# Patient Record
Sex: Female | Born: 1955
Health system: Southern US, Community
[De-identification: ages and names within clinical notes are randomized; demographics above are authoritative.]

## PROBLEM LIST (undated history)

## (undated) DIAGNOSIS — E785 Hyperlipidemia, unspecified: Secondary | ICD-10-CM

## (undated) DIAGNOSIS — H3530 Unspecified macular degeneration: Secondary | ICD-10-CM

## (undated) DIAGNOSIS — M1990 Unspecified osteoarthritis, unspecified site: Secondary | ICD-10-CM

## (undated) DIAGNOSIS — I96 Gangrene, not elsewhere classified: Secondary | ICD-10-CM

## (undated) DIAGNOSIS — F419 Anxiety disorder, unspecified: Secondary | ICD-10-CM

## (undated) DIAGNOSIS — I10 Essential (primary) hypertension: Secondary | ICD-10-CM

## (undated) DIAGNOSIS — I639 Cerebral infarction, unspecified: Secondary | ICD-10-CM

## (undated) DIAGNOSIS — A770 Spotted fever due to Rickettsia rickettsii: Secondary | ICD-10-CM

## (undated) HISTORY — DX: Anxiety disorder, unspecified: F41.9

## (undated) HISTORY — PX: OTHER SURGICAL HISTORY: SHX169

## (undated) HISTORY — DX: Gangrene, not elsewhere classified: I96

## (undated) HISTORY — DX: Unspecified osteoarthritis, unspecified site: M19.90

## (undated) HISTORY — DX: Spotted fever due to Rickettsia rickettsii: A77.0

## (undated) HISTORY — DX: Cerebral infarction, unspecified: I63.9

## (undated) HISTORY — DX: Essential (primary) hypertension: I10

## (undated) HISTORY — DX: Hyperlipidemia, unspecified: E78.5

## (undated) HISTORY — DX: Unspecified macular degeneration: H35.30

---

## 1998-05-14 ENCOUNTER — Ambulatory Visit (HOSPITAL_COMMUNITY): Admission: RE | Admit: 1998-05-14 | Discharge: 1998-05-14 | Payer: Self-pay | Admitting: Obstetrics and Gynecology

## 1998-08-16 ENCOUNTER — Ambulatory Visit (HOSPITAL_COMMUNITY): Admission: RE | Admit: 1998-08-16 | Discharge: 1998-08-16 | Payer: Self-pay | Admitting: Obstetrics and Gynecology

## 1999-05-25 ENCOUNTER — Ambulatory Visit (HOSPITAL_COMMUNITY): Admission: RE | Admit: 1999-05-25 | Discharge: 1999-05-25 | Payer: Self-pay | Admitting: Obstetrics and Gynecology

## 1999-05-25 ENCOUNTER — Encounter: Payer: Self-pay | Admitting: Obstetrics and Gynecology

## 1999-06-03 ENCOUNTER — Ambulatory Visit (HOSPITAL_COMMUNITY): Admission: RE | Admit: 1999-06-03 | Discharge: 1999-06-03 | Payer: Self-pay | Admitting: Obstetrics and Gynecology

## 1999-07-05 ENCOUNTER — Other Ambulatory Visit: Admission: RE | Admit: 1999-07-05 | Discharge: 1999-07-05 | Payer: Self-pay | Admitting: Obstetrics and Gynecology

## 1999-09-01 ENCOUNTER — Encounter (INDEPENDENT_AMBULATORY_CARE_PROVIDER_SITE_OTHER): Payer: Self-pay | Admitting: Specialist

## 1999-09-01 ENCOUNTER — Ambulatory Visit (HOSPITAL_COMMUNITY): Admission: RE | Admit: 1999-09-01 | Discharge: 1999-09-01 | Payer: Self-pay | Admitting: Gastroenterology

## 1999-09-15 ENCOUNTER — Encounter (INDEPENDENT_AMBULATORY_CARE_PROVIDER_SITE_OTHER): Payer: Self-pay

## 1999-09-15 ENCOUNTER — Other Ambulatory Visit: Admission: RE | Admit: 1999-09-15 | Discharge: 1999-09-15 | Payer: Self-pay | Admitting: Obstetrics and Gynecology

## 1999-10-05 ENCOUNTER — Other Ambulatory Visit: Admission: RE | Admit: 1999-10-05 | Discharge: 1999-10-05 | Payer: Self-pay | Admitting: Obstetrics and Gynecology

## 2000-06-04 ENCOUNTER — Ambulatory Visit (HOSPITAL_COMMUNITY): Admission: RE | Admit: 2000-06-04 | Discharge: 2000-06-04 | Payer: Self-pay | Admitting: Obstetrics and Gynecology

## 2000-06-04 ENCOUNTER — Encounter: Payer: Self-pay | Admitting: Obstetrics and Gynecology

## 2000-07-26 ENCOUNTER — Other Ambulatory Visit: Admission: RE | Admit: 2000-07-26 | Discharge: 2000-07-26 | Payer: Self-pay | Admitting: Obstetrics and Gynecology

## 2001-07-01 ENCOUNTER — Encounter: Payer: Self-pay | Admitting: Obstetrics and Gynecology

## 2001-07-01 ENCOUNTER — Ambulatory Visit (HOSPITAL_COMMUNITY): Admission: RE | Admit: 2001-07-01 | Discharge: 2001-07-01 | Payer: Self-pay | Admitting: Obstetrics and Gynecology

## 2001-07-30 ENCOUNTER — Other Ambulatory Visit: Admission: RE | Admit: 2001-07-30 | Discharge: 2001-07-30 | Payer: Self-pay | Admitting: Obstetrics and Gynecology

## 2001-11-13 DIAGNOSIS — I639 Cerebral infarction, unspecified: Secondary | ICD-10-CM

## 2001-11-13 DIAGNOSIS — A770 Spotted fever due to Rickettsia rickettsii: Secondary | ICD-10-CM

## 2001-11-13 HISTORY — DX: Cerebral infarction, unspecified: I63.9

## 2001-11-13 HISTORY — DX: Spotted fever due to Rickettsia rickettsii: A77.0

## 2002-04-05 ENCOUNTER — Encounter: Payer: Self-pay | Admitting: Emergency Medicine

## 2002-04-05 ENCOUNTER — Inpatient Hospital Stay (HOSPITAL_COMMUNITY): Admission: EM | Admit: 2002-04-05 | Discharge: 2002-05-06 | Payer: Self-pay | Admitting: Emergency Medicine

## 2002-04-05 ENCOUNTER — Encounter (INDEPENDENT_AMBULATORY_CARE_PROVIDER_SITE_OTHER): Payer: Self-pay | Admitting: Specialist

## 2002-04-05 ENCOUNTER — Encounter: Payer: Self-pay | Admitting: Internal Medicine

## 2002-04-06 ENCOUNTER — Encounter: Payer: Self-pay | Admitting: Internal Medicine

## 2002-04-07 ENCOUNTER — Encounter: Payer: Self-pay | Admitting: Internal Medicine

## 2002-04-08 ENCOUNTER — Encounter: Payer: Self-pay | Admitting: Internal Medicine

## 2002-04-09 ENCOUNTER — Encounter: Payer: Self-pay | Admitting: Internal Medicine

## 2002-04-10 ENCOUNTER — Encounter: Payer: Self-pay | Admitting: Internal Medicine

## 2002-04-11 ENCOUNTER — Encounter: Payer: Self-pay | Admitting: Internal Medicine

## 2002-04-12 ENCOUNTER — Encounter: Payer: Self-pay | Admitting: Internal Medicine

## 2002-04-13 ENCOUNTER — Encounter: Payer: Self-pay | Admitting: Critical Care Medicine

## 2002-04-14 ENCOUNTER — Encounter: Payer: Self-pay | Admitting: Internal Medicine

## 2002-04-16 ENCOUNTER — Encounter: Payer: Self-pay | Admitting: Internal Medicine

## 2002-04-17 ENCOUNTER — Encounter: Payer: Self-pay | Admitting: Internal Medicine

## 2002-04-18 ENCOUNTER — Encounter: Payer: Self-pay | Admitting: Internal Medicine

## 2002-04-19 ENCOUNTER — Encounter: Payer: Self-pay | Admitting: Internal Medicine

## 2002-04-26 ENCOUNTER — Encounter: Payer: Self-pay | Admitting: Internal Medicine

## 2002-04-27 ENCOUNTER — Encounter: Payer: Self-pay | Admitting: Internal Medicine

## 2002-04-29 ENCOUNTER — Encounter: Payer: Self-pay | Admitting: Internal Medicine

## 2002-05-01 ENCOUNTER — Encounter: Payer: Self-pay | Admitting: Internal Medicine

## 2002-05-03 ENCOUNTER — Encounter: Payer: Self-pay | Admitting: Internal Medicine

## 2002-05-06 ENCOUNTER — Inpatient Hospital Stay (HOSPITAL_COMMUNITY)
Admission: AD | Admit: 2002-05-06 | Discharge: 2002-05-27 | Payer: Self-pay | Admitting: Physical Medicine & Rehabilitation

## 2002-06-02 ENCOUNTER — Encounter
Admission: RE | Admit: 2002-06-02 | Discharge: 2002-08-01 | Payer: Self-pay | Admitting: Physical Medicine & Rehabilitation

## 2002-08-19 ENCOUNTER — Encounter: Payer: Self-pay | Admitting: Obstetrics and Gynecology

## 2002-08-19 ENCOUNTER — Ambulatory Visit (HOSPITAL_COMMUNITY): Admission: RE | Admit: 2002-08-19 | Discharge: 2002-08-19 | Payer: Self-pay | Admitting: Obstetrics and Gynecology

## 2002-09-11 ENCOUNTER — Other Ambulatory Visit: Admission: RE | Admit: 2002-09-11 | Discharge: 2002-09-11 | Payer: Self-pay | Admitting: Obstetrics and Gynecology

## 2002-12-18 ENCOUNTER — Ambulatory Visit (HOSPITAL_BASED_OUTPATIENT_CLINIC_OR_DEPARTMENT_OTHER): Admission: RE | Admit: 2002-12-18 | Discharge: 2002-12-18 | Payer: Self-pay | Admitting: Orthopedic Surgery

## 2003-02-25 ENCOUNTER — Encounter
Admission: RE | Admit: 2003-02-25 | Discharge: 2003-04-06 | Payer: Self-pay | Admitting: Physical Medicine & Rehabilitation

## 2003-10-01 ENCOUNTER — Ambulatory Visit (HOSPITAL_COMMUNITY): Admission: RE | Admit: 2003-10-01 | Discharge: 2003-10-01 | Payer: Self-pay | Admitting: Obstetrics and Gynecology

## 2003-10-27 ENCOUNTER — Other Ambulatory Visit: Admission: RE | Admit: 2003-10-27 | Discharge: 2003-10-27 | Payer: Self-pay | Admitting: Obstetrics and Gynecology

## 2004-05-19 ENCOUNTER — Encounter: Admission: RE | Admit: 2004-05-19 | Discharge: 2004-07-21 | Payer: Self-pay | Admitting: Obstetrics and Gynecology

## 2004-10-20 ENCOUNTER — Ambulatory Visit (HOSPITAL_COMMUNITY): Admission: RE | Admit: 2004-10-20 | Discharge: 2004-10-20 | Payer: Self-pay | Admitting: Obstetrics and Gynecology

## 2005-03-20 ENCOUNTER — Ambulatory Visit: Payer: Self-pay | Admitting: Gastroenterology

## 2005-03-31 ENCOUNTER — Ambulatory Visit: Payer: Self-pay | Admitting: Gastroenterology

## 2005-11-29 ENCOUNTER — Ambulatory Visit (HOSPITAL_COMMUNITY): Admission: RE | Admit: 2005-11-29 | Discharge: 2005-11-29 | Payer: Self-pay | Admitting: Obstetrics and Gynecology

## 2006-12-05 ENCOUNTER — Ambulatory Visit (HOSPITAL_COMMUNITY): Admission: RE | Admit: 2006-12-05 | Discharge: 2006-12-05 | Payer: Self-pay | Admitting: Obstetrics and Gynecology

## 2007-12-10 ENCOUNTER — Ambulatory Visit (HOSPITAL_COMMUNITY): Admission: RE | Admit: 2007-12-10 | Discharge: 2007-12-10 | Payer: Self-pay | Admitting: Obstetrics and Gynecology

## 2008-12-17 ENCOUNTER — Ambulatory Visit (HOSPITAL_COMMUNITY): Admission: RE | Admit: 2008-12-17 | Discharge: 2008-12-17 | Payer: Self-pay | Admitting: Obstetrics and Gynecology

## 2009-12-30 ENCOUNTER — Ambulatory Visit (HOSPITAL_COMMUNITY): Admission: RE | Admit: 2009-12-30 | Discharge: 2009-12-30 | Payer: Self-pay | Admitting: Obstetrics and Gynecology

## 2010-03-09 ENCOUNTER — Encounter (INDEPENDENT_AMBULATORY_CARE_PROVIDER_SITE_OTHER): Payer: Self-pay | Admitting: *Deleted

## 2010-03-11 ENCOUNTER — Ambulatory Visit: Payer: Self-pay | Admitting: Gastroenterology

## 2010-03-25 ENCOUNTER — Ambulatory Visit: Payer: Self-pay | Admitting: Gastroenterology

## 2010-03-28 ENCOUNTER — Encounter: Payer: Self-pay | Admitting: Gastroenterology

## 2010-12-04 ENCOUNTER — Encounter: Payer: Self-pay | Admitting: Obstetrics and Gynecology

## 2010-12-15 NOTE — Letter (Signed)
Summary: [facility] Instructions  [facility]  [address] [place], [place] [address]   Phone: [phone]  Fax: [phone]       [facility] Tammy [name]    18-Oct-1956    MRN: [id number]        Procedure Day Dorna Bloom:  Farrell Ours  03/25/10     Arrival Time:  9:30AM      Procedure Time:  10:30AM     Location of Procedure:                    _X _  [facility] (4th Floor)                        PREPARATION FOR COLONOSCOPY WITH MOVIPREP   Starting 5 days prior to your procedure 03/20/10 do not eat nuts, seeds, popcorn, corn, beans, peas,  salads, or any raw vegetables.  Do not take any fiber supplements (e.g. Metamucil, Citrucel, and Benefiber).  THE DAY BEFORE YOUR PROCEDURE         DATE: 03/24/10  DAY: THURSDAY  1.  Drink clear liquids the entire day-NO SOLID FOOD  2.  Do not drink anything colored red or purple.  Avoid juices with pulp.  No orange juice.  3.  Drink at least 64 oz. (8 glasses) of fluid/clear liquids during the day to prevent dehydration and help the prep work efficiently.  CLEAR LIQUIDS INCLUDE: Water Jello Ice Popsicles Tea (sugar ok, no milk/cream) Powdered fruit flavored drinks Coffee (sugar ok, no milk/cream) Gatorade Juice: apple, Insley grape, Saric cranberry  Lemonade Clear bullion, consomm, broth Carbonated beverages (any kind) Strained chicken noodle soup Hard Candy                             4.  In the morning, mix first dose of MoviPrep solution:    Empty 1 Pouch A and 1 Pouch B into the disposable container    Add lukewarm drinking water to the top line of the container. Mix to dissolve    Refrigerate (mixed solution should be used within 24 hrs)  5.  Begin drinking the prep at 5:00 p.m. The MoviPrep container is divided by 4 marks.   Every 15 minutes drink the solution down to the next mark (approximately 8 oz) until the full liter is complete.   6.  Follow completed prep with 16 oz of clear liquid of your choice  (Nothing red or purple).  Continue to drink clear liquids until bedtime.  7.  Before going to bed, mix second dose of MoviPrep solution:    Empty 1 Pouch A and 1 Pouch B into the disposable container    Add lukewarm drinking water to the top line of the container. Mix to dissolve    Refrigerate  THE DAY OF YOUR PROCEDURE      DATE: 03/25/10  DAY: FRIDAY  Beginning at 5:30AM (5 hours before procedure):         1. Every 15 minutes, drink the solution down to the next mark (approx 8 oz) until the full liter is complete.  2. Follow completed prep with 16 oz. of clear liquid of your choice.    3. You may drink clear liquids until 8:30AM (2 HOURS BEFORE PROCEDURE).   MEDICATION INSTRUCTIONS  Unless otherwise instructed, you should take regular prescription medications with a small sip of water   as early as possible the  morning of your procedure.         OTHER INSTRUCTIONS  You will need a responsible adult at least 57 years of age to accompany you and drive you home.   This person must remain in the waiting room during your procedure.  Wear loose fitting clothing that is easily removed.  Leave jewelry and other valuables at home.  However, you may wish to bring a book to read or  an iPod/MP3 player to listen to music as you wait for your procedure to start.  Remove all body piercing jewelry and leave at home.  Total time from sign-in until discharge is approximately 2-3 hours.  You should go home directly after your procedure and rest.  You can resume normal activities the  day after your procedure.  The day of your procedure you should not:   Drive   Make legal decisions   Operate machinery   Drink alcohol   Return to work  You will receive specific instructions about eating, activities and medications before you leave.    The above instructions have been reviewed and explained to me by   Wyona Almas RN  March 11, 2010 9:39 AM     I fully understand  and can verbalize these instructions _____________________________ Date _________

## 2010-12-15 NOTE — Letter (Signed)
Summary: Patient Notice- Polyp Results  [facility]  [address] [place], [place] [address]   Phone: [phone]  Fax: [phone]        Mar 28, 2010 MRN: [id number]    [facility] [address] [place], [place]  [address]    Dear Ms. Hollern,  I am pleased to inform you that the colon polyp(s) removed during your recent colonoscopy was (were) found to be benign (no cancer detected) upon pathologic examination.  I recommend you have a repeat colonoscopy examination in 5 years to look for recurrent polyps, as having colon polyps increases your risk for having recurrent polyps or even colon cancer in the future.  Should you develop new or worsening symptoms of abdominal pain, bowel habit changes or bleeding from the rectum or bowels, please schedule an evaluation with either your primary care physician or with me.  Continue treatment plan as outlined the day of your exam.  Please call us if you are having persistent problems or have questions about your condition that have not been fully answered at this time.  Sincerely,  Meryl Dare MD [facility]  This letter has been electronically signed by your physician.  Appended Document: Patient Notice- Polyp Results letter mailed

## 2010-12-15 NOTE — Procedures (Signed)
Summary: Colonoscopy  Patient: Tammy [name] Note: All result statuses are Final unless otherwise noted.  Tests: (1) Colonoscopy (COL)   COL Colonoscopy           DONE     [facility]     [address]     [place], [place]  [address]           COLONOSCOPY PROCEDURE REPORT           PATIENT:  Tammy, [name]  MR#:  [id number]     BIRTHDATE:  October 10, 1956, 54 yrs. old  GENDER:  female           ENDOSCOPIST:  Judie Petit T. Russella Dar, MD, [facility]           PROCEDURE DATE:  03/25/2010     PROCEDURE:  Colonoscopy with snare polypectomy     ASA CLASS:  Class II     INDICATIONS:  Elevated Risk Screening, family history of colon     cancer, family Hx of polyps: MGM and M aunt with colon cancer.     Mother with colon polyps.           MEDICATIONS:   Fentanyl 100 mcg IV, Versed 7 mg IV           DESCRIPTION OF PROCEDURE:   After the risks benefits and     alternatives of the procedure were thoroughly explained, informed     consent was obtained.  Digital rectal exam was performed and     revealed no abnormalities.   The LB [id number]-Q180AL [id number] endoscope     was introduced through the anus and advanced to the cecum, which     was identified by both the appendix and ileocecal valve, without     limitations.  The quality of the prep was good, using MoviPrep.     The instrument was then slowly withdrawn as the colon was fully     examined.     <<PROCEDUREIMAGES>>           FINDINGS:  Melanosis coli was found throughout the colon and was     more prominent in the left colon.  A sessile polyp was found in     the mid transverse colon. It was 5 mm in size. Polyp was snared     without cautery. Retrieval was successful. A sessile polyp was     found in the sigmoid colon. It was 4 mm in size. Polyp was snared     without cautery. Retrieval was successful. Mild diverticulosis was     found in the sigmoid colon. This was otherwise a normal     examination of the colon.  Retroflexed views in the  rectum     revealed no abnormalities.  The time to cecum =  5.25  minutes.     The scope was then withdrawn (time =  11  min) from the patient     and the procedure completed.           COMPLICATIONS:  None           ENDOSCOPIC IMPRESSION:     1) Melanosis coli     2) 5 mm sessile polyp in the mid transverse colon     3) 4 mm sessile polyp in the sigmoid colon     4) Mild diverticulosis in the sigmoid colon     RECOMMENDATIONS:     1) Await pathology results     2) Repeat Colonoscopy  in 5 years pending review of pathology     3) High fiber diet with liberal fluid intake.           Venita Lick. Russella Dar, MD, [facility]           CC: Laurann Montana, MD           n.     Rosalie DoctorVenita Lick. Shaundrea Carrigg at 03/25/2010 11:24 AM           Terrence Dupont, [id number]  Note: An exclamation mark (!) indicates a result that was not dispersed into the flowsheet. Document Creation Date: 03/25/2010 11:25 AM _______________________________________________________________________  (1) Order result status: Final Collection or observation date-time: 03/25/2010 11:19 Requested date-time:  Receipt date-time:  Reported date-time:  Referring Physician:   Ordering Physician: Claudette Head [id number]) Specimen Source:  Source: Launa Grill Order Number: [id number] Lab site:   Appended Document: Colonoscopy     Procedures Next Due Date:    Colonoscopy: 03/2015

## 2010-12-15 NOTE — Miscellaneous (Signed)
Summary: [facility] Previsit/prep  Clinical Lists Changes  Medications: Added new medication of MOVIPREP 100 GM  SOLR (PEG-KCL-NACL-NASULF-NA ASC-C) As per prep instructions. - Signed Rx of MOVIPREP 100 GM  SOLR (PEG-KCL-NACL-NASULF-NA ASC-C) As per prep instructions.;  #1 x 0;  Signed;  Entered by: Wyona Almas RN;  Authorized by: Meryl Dare MD [facility];  Method used: Electronically to [facility]*, [address] [place], [place]  [address], Ph: [phone] or [phone], Fax: [phone] Observations: Added new observation of NKA: T (03/11/2010 8:55)    Prescriptions: MOVIPREP 100 GM  SOLR (PEG-KCL-NACL-NASULF-NA ASC-C) As per prep instructions.  #1 x 0   Entered by:   Wyona Almas RN   Authorized by:   Meryl Dare MD [facility]   Signed by:   Wyona Almas RN on 03/11/2010   Method used:   Electronically to        [facility]* (retail)       [address] [place], [place]  [address]       Ph: [phone] or [phone]       Fax: [phone]   RxID:   [id number]

## 2010-12-23 ENCOUNTER — Other Ambulatory Visit (HOSPITAL_COMMUNITY): Payer: Self-pay | Admitting: Obstetrics and Gynecology

## 2010-12-23 DIAGNOSIS — Z1231 Encounter for screening mammogram for malignant neoplasm of breast: Secondary | ICD-10-CM

## 2011-01-05 ENCOUNTER — Ambulatory Visit (HOSPITAL_COMMUNITY)
Admission: RE | Admit: 2011-01-05 | Discharge: 2011-01-05 | Disposition: A | Discharge status: Home / Self Care | Payer: BC Managed Care – PPO | Source: Ambulatory Visit | Attending: Obstetrics and Gynecology | Admitting: Obstetrics and Gynecology

## 2011-01-05 DIAGNOSIS — Z1231 Encounter for screening mammogram for malignant neoplasm of breast: Secondary | ICD-10-CM

## 2011-01-19 ENCOUNTER — Other Ambulatory Visit: Payer: Self-pay | Admitting: Dermatology

## 2011-02-13 ENCOUNTER — Other Ambulatory Visit: Payer: Self-pay | Admitting: Obstetrics & Gynecology

## 2011-03-31 NOTE — Op Note (Signed)
   NAME:  Tammy Bell, Tammy Bell                         ACCOUNT NO.:  [id number]   MEDICAL RECORD NO.:  [id number]                   PATIENT TYPE:  AMB   LOCATION:  DSC                                  FACILITY:  [facility]   PHYSICIAN:  Rodney A. Chaney Malling, M.D.           DATE OF BIRTH:  06-19-1956   DATE OF PROCEDURE:  12/18/2002  DATE OF DISCHARGE:                                 OPERATIVE REPORT   PREOPERATIVE DIAGNOSIS:  Abscess fourth toe, terminal phalanx, right foot.   POSTOPERATIVE DIAGNOSIS:  Abscess fourth toe, terminal phalanx, right foot.   OPERATION:  Partial phalangectomy, right fourth toe.   SURGEON:  Lenard Galloway. Chaney Malling, M.D.   ANESTHESIA:  General.   PROCEDURE:  The patient was placed on the operating table in supine position  with a pneumatic tourniquet about the right upper thigh.  The right lower  leg was prepped with DuraPrep and draped in the usual manner.  The foot was  draped out with an Esmarch.  An elliptical excision of the nailbed of the  terminal portion of the right fourth toe was done.  The incision was carried  proximally on both sides of the joint.  Dissection was carried down to the  bony phalanx.  This was totally excised.  No abscessed formation or sinus  tract was seen at this time.  The wounds were really quite clear.  Nevertheless, the terminal phalanx was removed.  No sinus tracts were seen.  The tissue was sent for cultures.  The wound was left wide open and a single  Steri-Strip was used to close one edge of the wound.  Sterile dressings were  then applied and the patient returned to the recovery room in excellent.  The patient tolerated the procedure well.   FOLLOW UP:  To see me in my office in one week.   DISCHARGE MEDICATIONS:  Keflex antibiotic.                                               Rodney A. Chaney Malling, M.D.    RAM/MEDQ  D:  12/18/2002  T:  12/18/2002  Job:  [id number]

## 2011-03-31 NOTE — Discharge Summary (Signed)
Tammy Bell. [facility]  Patient:    [name], [name] Visit Number: [id number] MRN: [id number]          Service Type: [facility] Location: 4000 [id number] Attending Physician:  Faith Rogue T Dictated by:   Mcarthur Rossetti. Angiulli, P.A. Admit Date:  05/06/2002 Discharge Date: 05/27/2002   CC:         Santina Evans A. Orlin Hilding, M.D.  Tanya Nones. Jeral Fruit, M.D.  Jerl Santos, M.D.   Discharge Summary  DISCHARGE DIAGNOSES: 1. [facility] spotted fever with deconditioning. 2. Tracheostomy on April 17, 2002. 3. Since extubated, thrombocytopenia, resolved. 4. Dysphagia, resolved. 5. Acute renal failure, resolved. 6. Right frontal intraparenchymal hemorrhage with conservative care. 7. Dry gangrene to the toes, improved. 8. Hypertension.  HISTORY OF PRESENT ILLNESS: The patient is a 55 year old [name] female in her usual state of health until Apr 05, 2002 with noted fever, myalgia, and headache. On evaluation, noted non-pruritic rash. Platelet count 20,000, creatinine 3.7. Cranial CT scan was negative. Neurology follow-up with Dr. Orlin Hilding for altered mental status. Noted an acute right frontal intraparenchymal hemorrhage with follow-up cranial CT scan. Neurosurgery consulted Dr. Jeral Fruit. Conservative care noted. Critical Care Management per Dr. Jayme Cloud. The patient ventilator support. Tracheostomy performed on April 17, 2002. Hematology consult, Dr. Myna Hidalgo for thrombocytopenia and monitored. Subcutaneous Lovenox was added for deep vein thrombosis prophylaxis. Full workup felt to be [facility] spotted fever. The patient with digital gangrene to the toes. Cardiothoracic Surgery, Dr. Arbie Cookey consulted on April 30, 2002. Felt that the digits were dry with routine skin care. Her tracheostomy was down sized and later extubated on June 01, 2002. She was maintained on a Panda tube feed for a short time until diet advanced. Progressive gains in her mobility and endurance. She was  admitted to the rehab unit on May 06, 2002.  PAST MEDICAL HISTORY: Hypertension.  ALLERGIES: None.  SOCIAL HISTORY: Remote smoker. No alcohol.  PRIMARY CARE PHYSICIAN: Per records is Dr. Tresa Endo.  PAST SURGICAL HISTORY: 1. C-section. 2. Foot surgery. 3. Hernia repair.  FAMILY HISTORY: Lives with husband and three children in [place]. Independent prior to admission. 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  HOSPITAL COURSE: The patient did well while on rehab services with therapies initiated on a b.i.d. basis. The following issues are followed during her rehab course. Pertaining to Ms. Whites workup for [facility] spotted fever. She remained afebrile. Follow-up per neurology services. Her altered mental status continued to greatly improve and she was followed by speech therapy. Her awareness of her deficits had greatly improved as well as mobility and endurance. She was now ambulating with supervision. Full family teaching was completed. Day passes went well. Outpatient therapies had been arranged. She continued on subcutaneous Lovenox throughout her rehab stay until her mobility had improved. She exhibited no signs of deep vein thrombosis. Tracheostomy site had healed nicely. There was no shortness of breath. Her O2 sats were greater than 90% on room air. Her thrombocytopenia had resolved with latest platelet count of 361,000. Her diet had been advanced to a mechanical soft, tolerating well. Acute renal failure again had resolved with latest creatinine levels within normal limits. She received conservative care for a right frontal intraparenchymal hemorrhage per Dr. Jeral Fruit. This would received follow-up in one month. Dry gangrene to the toes with conservative care. Monitored by Dr. Karlene Lineman of Cardiothoracic Surgery. She had strong pedal pulses. Blood pressure controlled without the use of antihypertensive medication. Overall for functional mobility she  was supervision in all areas of bathing, dressing and toileting. She was continent of bowel and bladder. Ambulating supervision greater than 200 feet. Her comprehension expression, attention, awareness, and memory had now greatly improved. She needed very minimal cues. She initially had some difficulty with voiding. She received a short course of Flomax. Latest bladder scans had greatly improved. Flomax was discontinued.  LABORATORY DATA: Sodium 137, potassium 3.4, BUN 7, creatinine 0.5, hemoglobin 14.2, hematocrit 42.7.  DISCHARGE MEDICATIONS: 1. Multivitamin daily. 2. Tylenol as needed.  ACTIVITY: As tolerated.  DIET: Soft.  SPECIAL INSTRUCTIONS: No driving. Outpatient PT and OT.  FOLLOW-UP: With Dr. Riley Kill, Outpatient Rehab Medicine in one month. Dr. Tresa Endo of Medical Management, and Dr. Jeral Fruit of Neurosurgery. Dictated by:   Mcarthur Rossetti. Angiulli, P.A. Attending Physician:  Faith Rogue T DD:  05/26/02 TD:  05/27/02 Job: [id number]

## 2011-03-31 NOTE — Consult Note (Signed)
[facility]  Patient:    Tammy Bell, Tammy Bell Visit Number: [id number] MRN: [id number]          Service Type: MED Location: MICU 2114 01 Attending Physician:  Jerl Santos Dictated by:   Rose Phi. Myna Hidalgo, M.D. Proc. Date: 04/06/02 Admit Date:  04/05/2002   CC:         Danice Goltz, M.D.  Jerl Santos, M.D.  Dewayne Shorter, M.D.   Consultation Report  REFERRING PHYSICIAN:  Danice Goltz, M.D.  REASON FOR CONSULTATION:  Probable thrombotic thrombocytopenic purpura.  HISTORY OF PRESENT ILLNESS:  The patient is a very nice, 55 year old, Caucasian female with no obvious past medical history.  She had been on a blood pressure medication and amitriptyline.  She began to develop some headaches.  These were almost migraine like.  She subsequently began to develop a temperature and muscle aches.  There was some agitation.  She then began to develop a rash.  The rash was worsening.  She subsequently became hypertensive and agitated.  She was taken to the emergency room.  She had blood work done in the emergency room.  She had a Benyo cell count of 9.4, a hemoglobin of 12, a hematocrit of 34, and a platelet count of 20,000.  Her Gatlin count differential was 98 segs, 1 lymph, and 1 monocyte.  Her BUN was 68 and her creatinine was 3.7.  The sodium was 136 and potassium 3.6.  Her LFTs were elevated.  The PT was 12.5 seconds and the PTT was 34 seconds.  She was admitted to the ICU.  She subsequently had a CT scan of the brain done.  She had a seizure.  CT of the brain was negative.  There was concern about [facility] spotted fever.  She denies any obvious tick bites.  Her husband, however, has had some ticks on him.  [place]  She subsequently has decompensated.  She has ID see her.  She has had a bacterial titer sent off for [facility] spotted fever.  She has developed a worsening lactic  acidosis.  Her blood work on Apr 05, 2002, showed a Iannello cell count of 9.4, hemoglobin 12, hematocrit 34, and platelet count 20,000.  Today, her laboratory studies show a Heaps cell count of 16, hemoglobin 11.6, hematocrit 33.5, and platelet count 14,000.  The pro time was up to 17.3 seconds.  Her BUN was 59 and her creatinine was 2.7.  A blood gas done was 7.11.  A DIC panel was done.  Her pro time was 14.4 seconds.  The PTT was 42. Fibrinogen was low at 198.  A D-dimer was greater than 20.  According to the family, she really has not done anything unusual.  There has been no unusual food.  She has not eaten any kind of undercooked meat.  She has not travelled anywhere.  She has been teaching special education.  PAST MEDICAL HISTORY:  Hypertension.  ALLERGIES:  None.  MEDICATIONS: 1. Unknown beta blocker. 2. Amitriptyline 25 mg p.o. q.h.s. p.r.n.  SOCIAL HISTORY:  She is married.  She has three children.  She has no tobacco use.  There is rare alcohol use.  FAMILY HISTORY:  Remarkable for her father dying of pancreatic cancer 10 years ago.  There is hyperlipidemia.  REVIEW OF SYSTEMS:  As dictated in the history of present illness.  PHYSICAL EXAMINATION:  This is a fairly well-developed, well-nourished, [place]  female.  She is intubated.  She is sedated.  VITAL SIGNS:  Temperature 100.3 degrees (axillary), pulse 112, respiratory rate 32, blood pressure 90/60.  HEENT:  Normocephalic and atraumatic skull.  She does have an endotracheal tube in.  NECK:  No adenopathy is noted.  LUNGS:  Clear bilaterally.  CARDIAC:  Tachycardic and regular.  There are no murmurs, rubs, or bruits.  ABDOMEN:  Soft with good bowel sounds.  There is no palpable abdominal mass. No palpable hepatosplenomegaly.  BACK:  No tenderness over the spine, ribs, or hips.  EXTREMITIES:  No clubbing, cyanosis, or edema.  LABORATORY DATA:  Her peripheral blood smear shows a normochromic  normocytic population of red blood cells.  She has no schistocytes.  There are no nucleated red blood cells.  I see no nuclear formation.  The Ceci cells appear to be normal in morphology and maturation.  No immature myeloid or lymphoid forms are noted.  The platelets are quite decreased in number.  The platelets are quite large.  IMPRESSION:  The patient is a 55 year old [place] female with a severe systemic condition.  It is very difficult to put all of this together.  One certainly could consider TTP in the differential diagnosis.  However, I have never seen a patient who has not had hemolytic anemia as hemolysis is the definite hallmark for TTP.  Her reticulocyte count is 0.3.  Her LDH is elevated, but I think this is clearly more related to underlying liver involvement.  I really do not feel strongly about plasma exchange for the patient.  I really do not wish to put her through plasma exchange at the present time just because of the critical nature of her situation and the hypotension.  If I put her on a phoresis machine, this could certainly worsen her hypotension.  I will certainly follow her CBC daily.  If I do see any hint of hemolysis, then I think that we do need to pursue plasma exchange.  She is young and we certainly do need to be aggressive with her.  Again, I think that the peripheral blood smear is very important in determining the course of her care.  I just do not feel strongly regarding plasma exchange just because of the fact that she is not hemolyzing at the present time.  DIC certainly is a strong consideration given her DIC panel.  Of note, her sedimentation rate is minimally elevated and this, I would expect to have been higher.  I think it would be safe for her to be transfused.  I would only transfer her  if she was bleeding.  I will follow daily CBCs/reticulocyte counts/LDH/blood smears.  Thank you so much for allowing me to see this patient.  She  is an incredibly complicated case.  There is really no right or wrong answer for her and we just need to be as aggressive as possible with her care. Dictated by:   Rose Phi. Myna Hidalgo, M.D. Attending Physician:  Jerl Santos DD:  04/06/02 TD:  04/06/02 Job: [id number]

## 2011-03-31 NOTE — Discharge Summary (Signed)
[place]. [facility]  Patient:    Tammy Bell, Tammy Bell Visit Number: [id number] MRN: [id number]          Service Type: [facility] Location: 4000 [id number] Attending Physician:  Ranelle Oyster Dictated by:   Oley Balm Sung Amabile, M.D. [facility] Admit Date:  05/06/2002 Disc. Date: 05/06/02   CC:         Jerl Santos, M.D.  Catherine A. Orlin Hilding, M.D.  Tanya Nones. Jeral Fruit, M.D.  Gloris Manchester. Lazarus Salines, M.D.  Theressa Millard, M.D.   Discharge Summary  DATE OF BIRTH:  Apr 12, 1956  ADMISSION DIAGNOSES: 1. Fever. 2. Headache. 3. Acute renal failure. 4. Thrombocytopenia. 5. Elevated liver function tests. 6. Rash. 7. Abdominal tenderness.  DISCHARGE DIAGNOSES: 1. Severe [facility] spotted fever.    a. Vasculitis with ischemic toes and microthrombi.    b. Thrombocytopenia and disseminated intravascular coagulation.    c. Intracranial hemorrhage/cerebrovascular accident.    d. Severe sepsis. 2. Ventilator-dependent respiratory failure. 3. Seizures secondary to above. 4. Dysphagia, tracheostomy dependent, resolved. 5. Anemia. 6. Hyperglycemia. 7. Profound debilitation. 8. Critical illness polyneuropathy.  HISTORY OF PRESENT ILLNESS:  Please refer to admission history and physical for this patients initial presentation.  Briefly, she is a 55 year old woman who was in excellent health prior to her current illness.  She presented as an outpatient with fever, myalgias and headache; she also had a rash.  She was seen in the office on the day prior to admission and some blood work was drawn.  When the results returned, the patient was contacted for admission. She was initially found to have thrombocytopenia and elevated liver function tests.  Upon arrival to the hospital, she was noted to be hypotensive.  She was started immediately on doxycycline for the concern regarding a tick-borne illness, though no definite history of tick bite was provided.  She was admitted to  the intensive care unit.  HOSPITAL COURSE:  For the critical care medicine service, she was initially seen by Dr. Danice Goltz; this was on Apr 05, 2002.  Dr. Jayme Cloud noted severe sepsis secondary to possible [facility] spotted fever; she was also concerned about a severe viral illness such as mononucleosis; lastly, she raised the concern for thrombotic thrombocytopenic purpura.  Dr. Jayme Cloud recommended volume resuscitation and therapy with Rocephin and doxycycline. She requested input from infectious disease specialists.  For hypotension, she started norepinephrine.  The patient progressively worsened over the first 24 hours and required her intubation on Apr 06, 2002.  She required continued aggressive supportive for sepsis.  She was seen in consultation by Dr. Rose Phi. Ennever and he noted that there was no evidence of hemolysis, therefore, thought that TTP was unlikely.  She remained critically ill for many days and developed vasculitic changes with digital infarcts on both lower extremities.  There was altered mental status and she was noted to have small hemorrhagic cerebral infarcts; she was seen in consultation by Dr. Tanya Nones. Botero for this.  No surgery was indicated at that time.  One of the skin lesions was biopsied fairly early in the course of her illness and this did indeed show rickettsial organisms, confirming the diagnosis of [facility] spotted fever.  Her hospital course was complicated by the conditions listed above in the discharge diagnoses.  She ultimately underwent tracheostomy tube placement and was successfully weaned from mechanical ventilation by early June.  The rest of her hospital course involved by predominantly rehabilitation.  She was noted to be  profoundly weak with evidence of critical illness polyneuropathy.  She was noted to have mental status changes and depressed level of consciousness, for which she was seen by Dr. Gustavus Messing.  Weymann.  The last couple of weeks of her hospitalization really involved fairly dramatic improvement on all fronts.  At the time of this dictation, she is mentating reasonably well and communicative.  She is now decannulated.  The gangrenous areas on her toes are improving dramatically and it is felt that there is no indication for surgery.  She remains profoundly weak, greater on the left than the right side, but has worked well with occupational and physical therapy, making dramatic strides.  At this time, she is markedly improved and is now stable for discharge to the rehabilitation medicine service.  MEDICATIONS ON DISCHARGE:  Lovenox 40 mg subcu q.d.  CURRENT DIET:  Dysphagia III with chopped meats and nectar-thick liquids. Panda tube was removed on May 05, 2002. Dictated by:   Oley Balm Sung Amabile, M.D. [facility] Attending Physician:  Faith Rogue T DD:  05/06/02 TD:  05/07/02 Job: [id number]

## 2011-03-31 NOTE — Consult Note (Signed)
[place]. [facility]  Patient:    MAYCI, HANING Visit Number: [id number] MRN: [id number]          Service Type: MED Location: MICU 2114 01 Attending Physician:  Jerl Santos Dictated by:   Gloris Manchester. Lazarus Salines, M.D. Proc. Date: 04/14/02 Admit Date:  04/05/2002   CC:         Jerl Santos, M.D.  Danice Goltz, M.D.   Consultation Report  CHIEF COMPLAINT:  Prolonged intubation.  HISTORY OF PRESENT ILLNESS:  A 55 year old [place] female admitted to the hospital 10 days ago with severe sepsis and multi-organ system failure.  Was eventually discovered by positive skin biopsies to have [facility] Spotted fever with sequelae related there to.  She has had a rocky course including liver and hepatic insufficiency, respiratory insufficiency, intracranial and subarachnoid hemorrhage, thrombocytopenia, and vascular compromise.  She is making steady progress on multiple fronts.  She has been intubated 10 days. In anticipation of a prolonged wean and recovery, tracheostomy has been requested for airway and ventilatory support.  Patient has not been particularly agitated with the tube in place.  She has never had a tracheostomy before nor significant intubation, or in fact really any significant medical conditions.  No history of thyroid surgery.  She does not smoke.  PHYSICAL EXAMINATION  GENERAL:  This is a middle-aged [place] female who is orotracheally intubated. She is minimally responsive to stimuli.  I did not attempt to stimulate her greatly.  NECK:  She has palpably normal lower neck anatomy with no distinctly palpable thyroid gland.  IMPRESSION:  Prolonged intubation with anticipated slow physical, medical, and neurologic recovery.  PLAN:  I agree with indication for tracheostomy for airway security and comfort as well as pulmonary toilet.  I discussed this with her husband.  We have her on the schedule for three days from now, but will see  if there is an opportunity to move her up unless her medical status or her medical team desires a delay.  I did obtain informed consent from her husband. Dictated by:   Gloris Manchester. Lazarus Salines, M.D. Attending Physician:  Jerl Santos DD:  04/14/02 TD:  04/16/02 Job: [id number]

## 2011-03-31 NOTE — Consult Note (Signed)
[place]. [facility]  Patient:    Tammy Bell, Tammy Bell Visit Number: [id number] MRN: [id number]          Service Type: MED Location: MICU 2114 01 Attending Physician:  Jerl Santos Dictated by:   Tanya Nones. Jeral Fruit, M.D. Proc. Date: 04/07/02 Admit Date:  04/05/2002                            Consultation Report  HISTORY OF PRESENT ILLNESS:  The patient is a 55 year old female who was admitted to the hospital two days ago with a history of fever, muscle pain, flash and the findings showed low platelet count, increase of the liver function tests, fever, increase of the renal function tests. The patient was confused, she was seen by infectious disease. It seems that last night she had a episode of seizure. She had a CT scan which was essentially negative. Today, she had a new CT scan and because of the findings we were called for evaluation. The patient at the present time is sedated, she is on a ventilator. The platelets were 9000 this morning and has increased up to 35,000. At the present time, she is getting platelets plus fresh frozen plasma. Her sodium was 149, potassium was 2.9. The Daughtrey count is 23.1. The INR is 2.0, the PT is 52.  PHYSICAL EXAMINATION:  NEUROLOGICAL:  Clinically, the pupils are 2 mm, and she withdraws to pain. She is sedated.  VITAL SIGNS:  Her vital signs at the present time are pulse of 110 and blood pressure 136/90.  IMAGING STUDIES:  I have reviewed the CT scan myself, and there is a small intracerebral hematoma in the frontal area less than 1 cm with no shift and there is a subarachnoid hemorrhage.  FINDINGS:  The patient will not require any type of surgical intervention at the present time, but nevertheless it is really worrisome about this lady having a coagulopathy and with already a change in the CT scan less than 24 hours after she had a normal one. I talked to Dr. Shan Levans at length while the husband was  present. I mentioned to him that at the present time there is no need for any surgical intervention or any type of monitoring of the brain is highly contraindicated because of the coagulopathy. What I am going to proceed with now is that we are going to repeat the CT scan tomorrow morning. The crisis here in her case would be if suddenly the intracerebral hematoma increased in size and she remained with the coagulopathy. This is a serious situation and the husband is fully aware. Dictated by:   Tanya Nones. Jeral Fruit, M.D. Attending Physician:  Jerl Santos DD:  04/07/02 TD:  04/08/02 Job: [id number]

## 2011-03-31 NOTE — Consult Note (Signed)
[place]. [facility]  Patient:    Tammy Bell, Tammy Bell Visit Number: [id number] MRN: [id number]          Service Type: MED Location: MICU 2114 01 Attending Physician:  Jerl Santos Dictated by:   Danice Goltz, M.D. Proc. Date: 04/05/02 Admit Date:  04/05/2002   CC:         Theressa Millard, M.D.  Jerl Santos, M.D.  Dewayne Shorter, M.D.   Consultation Report  DATE OF BIRTH:  [birth year]  REFERRING PHYSICIAN:  Theressa Millard, M.D.  REASON FOR CONSULTATION:  Assistance with septic shock and hypovolemia.  I spent an hour of critical care time between 1840 hours to 1940 hours attending to this patient.  HISTORY:  Ms. Sweeting is a 55 year old [place] female who has been in a usual state of good health until the 18th of May, 2003, when she developed fever, myalgias, and a headache.  She apparently had fever for approximately 3 days with rather severe myalgias, somewhat responsive to ibuprofen.  She developed headaches, more pronounced from her usual migraines, approximately 2 days prior to admission.  She also has developed a rash which has become more pronounced on the day of admission.  She was seen at her primary care physicians office, Dr. Tresa Endo, the day prior to admission but at the time, the patient did not appear toxic though her platelet count was later noted to be decreased at 29,000.  She then presented to [facility] today for a repeat CBC but she was found to be delirious and confused.  Because of that, she was directed to the emergency room whereupon evaluation revealed her to be extremely hypotensive.  The ER physician attempted a left IJ line; however, this was without success.  A left femoral line was then placed without difficulty.  Because of her findings, she was empirically started on doxycycline, suspecting [facility] spotted fever.  Husband states that they remove ticks from each other "all the time" as they have pets  that become infested.  She has had, however, no ticks noted recently.  She is an avid gardener and does spend time in the garden.  Last menstrual period was approximately 2 weeks ago.  No history of tampon use since then.  No other recent exposures to infectious diseases are noted.  PAST MEDICAL HISTORY:  Remarkable for migraine headaches, menorrhagia, status post hernia repair.  She has a history of palpitations with normal 2D echocardiogram done recently.  She had an impaled needle removed from a foot in 1965.  ALLERGIES:  No known allergies.  CURRENT MEDICATIONS:  Antihypertensive medication of unknown etiology.  SOCIAL HISTORY:  She is married.  She has one child.  She is a nonsmoker.  She is a [employer] manager.  FAMILY HISTORY:  Noncontributory for this specific problem; however, she has no history of bleeding dyscrasia in the family.  REVIEW OF SYSTEMS:  Noncontributory per history of pain from husband.  The patient is too confused to give an adequate history.  PHYSICAL EXAMINATION:  GENERAL:  The patient is obviously agitated and confused.  VITAL SIGNS:  She has a blood pressure of 80/60 with a mean of 56.  She is requiring IV fluids.  Oxygen saturation is 98% on 5 liters nasal cannula O2. Heart rate is 101.  Currently she is afebrile.  HEENT:  Unremarkable with pupils being equal, reactive, and round.  They react to light and accommodation.  Oropharynx is noted to be  parched oral mucosa.  NECK:  Supple but no meningeal signs.  LUNGS:  Lungs have some scattered rhonchi throughout but no wheezes.  CARDIAC EXAMINATION:  Slightly tachycardic.  No rubs, murmurs, or gallops heard.  ABDOMEN:  Soft, nontender.  Normoactive bowel sounds heard.  GU:  The patient has a Foley in place.  GYNECOLOGIC:  I did perform a gynecological bimanual examination which revealed exquisite tenderness on manipulation of the cervix as well as tenderness around the adnexa.  No  masses could be felt.  Specifically I also did not feel any retained tampons.  We did obtain a chlamydia probe.  I did do this bimanual exam without a speculum.  EXTREMITIES:  The patient has greatly diminished capillary refill distally with cyanotic toes.  Pulses are intact.  She has no edema.  SKIN:  Shows diffuse maculopapular rash, mostly sparing arms, feet, and face. She has specifically no petechiae and no splinter hemorrhages.  NEUROLOGIC EXAMINATION:  The patient has grossly a nonfocal exam; however, she is agitated and delirious.  LABORATORY DATA:  CBC:  Ambs count of 9.4, H&H of 12.1 and 34.2, platelet count of 20,000.  She has over 20% bands.  Dohle bodies noted on toxic granulations.  Urinalysis was unremarkable.  PT, PTT and INR were normal. Electrolytes were within normal limits.  BUN elevated at 68, creatinine 3.7, bilirubin 1.3, alkaline phosphatase 158, SGOT 193, SGPT 197.  Albumin was 2.5 with a total protein of 5.3.  Arterial blood gas:  pH of 7.33, pCO2 of 29, PAO2 of 137 on 5 liters nasal cannula O2.  Chest x-ray revealed thoroughly diffuse infiltrates.  This is, however, by report as I am not able to trace the film at present.  IMPRESSION: 1. Severe sepsis secondary to questionable [facility] spotted fever.    Differential diagnosis includes acute hepatitis versus mononucleosis with    a viral syndrome; however, this is doubtful.  I am concerned with the    patients pelvic findings and therefore, pelvic inflammatory disease with    Fitzhugh-Curtis syndrome should be considered.  Noninfectious etiologies    could include idiopathic thrombocytopenic purpura (ITP) versus (TTP)    thrombotic thrombocytopenic purpura; however, the lack of petechiae makes    this diagnosis less likely.  2. Systemic inflammatory reaction syndrome with multi-organ dysfunction    syndrome secondary to the above with potential acute lung injury and    hypoxemia secondary to the  above; doubt acute pneumonia. 3. Severe hypovolemic shock. 4. Metabolic acidosis secondary to lactic acidosis due to hyperperfusion. 5. Possible (DIC) disseminated intravascular coagulation.  PLAN:  1. Aggressively resuscitate with volume, crystalloid bolus as well as     colloid.  2. Check DIC panel.  3. Check for chlamydia, GYN probe.  4. I do agree with the therapy with Rocephin and doxycycline and input with     infectious disease as obtained currently.  5. Will use vasopressors as needed in the form of Levophed if her blood     pressure fails to respond to fluids.  6. If the patient shows no significant improvement in the next 12-24 hours,     given she is so desperately ill, will consider infusion of Xigris after     repletion of platelets.  7. Will provide GI prophylaxis with Protonix.  8. Will hold all heparin products given decreased platelets as well as no PAS     hose given increased capillary fracture tendency with these in a patient  with low platelets.  9. Will assume care of the patient while in the ICU per request of Dr.     Earl Gala. 10. Further plans pending response of patient to the above. Dictated by:   Danice Goltz, M.D. Attending Physician:  Jerl Santos DD:  04/05/02 TD:  04/08/02 Job: [id number]

## 2011-03-31 NOTE — Consult Note (Signed)
[place]. [facility]  Patient:    Tammy, Bell Visit Number: [id number] MRN: [id number]          Service Type: MED Location: MICU 2114 01 Attending Physician:  Jerl Santos Dictated by:   Gustavus Messing Orlin Hilding, M.D. Proc. Date: 04/21/02 Admit Date:  04/05/2002   CC:         Jerl Santos, M.D.   Consultation Report  CHIEF COMPLAINT:  Altered mental status, [facility] spotted fever.  HISTORY OF PRESENT ILLNESS:  Tammy Bell is a 55 year old, previously well [place] woman admitted on May 24 with [facility] spotted fever with severe sequelae including sepsis, DIC, thrombocytopenia, ventilator-dependent respiratory failure now with tracheostomy, anemia, ICU neuropathy, hyperglycemia, ischemia to extremities, coma, intracranial hemorrhage, and seizures on admission.  She was seen by neurosurgery earlier because of small hemorrhages, question of increased intracranial pressure, and edema.  There was a question of a need for a drain to monitor intracranial pressure, however, this apparently was never required.  I am asked now to comment on the seizures, anticonvulsants, and prognosis.  The best I can tell, the patient has not had any seizures since admission according to the nursing staff and patients mother.  REVIEW OF SYSTEMS:  Unobtainable now.  According to the chart, she had some problems with benign palpitations.  PAST MEDICAL HISTORY:  Significant for the [facility] spotted fever with the complications outlined above; namely: sepsis, DIC, thrombocytopenia, ventilator-dependent respiratory failure, anemia, ICU neuropathy, hypoglcemia, ischemia to extremities, coma, intracranial hemorrhage, and seizure.  Prior to admission, she had only minor problems with migraine, menorrhagia, and remote herniorrhaphy.  MEDICATIONS:  Insulin, Prevacid, Diflucan, Lovenox, iron, Catapres, Tylenol, doxycycline, Dilantin just recently increased to 2  mg IV q.8h., and vancomycin.  ALLERGIES:  No known drug allergies.  SOCIAL HISTORY:  Married with one child.  No cigarette use.  Teaches special education.  FAMILY HISTORY:  Negative for bleeding dyscrasia.  PHYSICAL EXAMINATION:  VITAL SIGNS:  Temperature 98.4, pulse 86, blood pressure 145/70, respirations 26.  She has a tracheostomy.  HEENT:  Head is normocephalic, atraumatic.  SKIN:  She has ischemic changes of her feet, right greater than left.  NEUROLOGIC:  Mental status: She is dosing but arouses easily.  She nodded her head once to a question when I asked, "Can you hear me," but otherwise no response of either motor or head shake or head nod.  On further questioning, she did not shake or nod her head appropriately at any time.  She would not follow commands for me, although reportedly has been following commands intermittently of nursing staff.  Cranial nerves: Her pupils are equal and reactive.  She blinks to threat bilaterally.  I could not see disk margins well.  Extraocular movements are intact.  She tracks fully.  She has bilateral ______ ; no facial asymmetry. She has a cough.  Hearing is intact.  On motor exam, there is some motor wasting in the hands.  She has ischemic changes in her feet, right greater than left as already mentioned.  There is some spontaneous movement in the right arm at the biceps, at least 2/5.  She is otherwise not moving on command for me, although reportedly follows commands irratically.  There is slight withdrawal on the left, probably triple flexion response.  Reflexes are 1+ diffusely including ankles.  She has an upgoing toe on the left.  She does have debridement of some ischemic or gangrenous or necrotic changes on  the sole of her right foot, so I could not assess Babinski there.  Cannot assess coordination as she is not cooperative. Sensory: She will grimace to mild noxious stimulus in all four extremities.  DIAGNOSTIC DATA:  CT  scan of the brain shows gradually improving small areas of hemorrhage in the right anterior frontal region.  Questionably this is secondary hemorrhage of an infarct and some mid right frontal subarachnoid blood.  There has been resolution of a small area of presumed hemorrhage in the genu of the corpus callosum to the left.  No significant mass effect.  EEG from May 27 showed generalized slowing, questionable data, no seizures.  Dilantin level recently is only 3.7; she is now on 600 mg q.4h.  Her BMET and CBC are essentially normal.  IMPRESSION: 1. 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  This led to seizures.  The encephalopathy that we are seeing is probably more likely due to the [facility] spotted fever central nervous infection itself rather than the visible areas of bleed because similar findings in patients with other etiologies; for example, stroke or trauma, will probable not demonstrate as profound deficits as she has.  We might not be seeing all areas of micro-infarct on the CT.  RECOMMENDATIONS:  Regarding the seizures, she has had none reported for days or weeks according to the nursing staff, only when she first came in.  I would check free Dilantin level.  Pharmacy is attempting to adjust the dose.  I guess I would not push it any farther unless she has seizures.  However, adjunctive therapies all have potential adverse affects which are not desirable in this patient.  For example, Keppra has potential serious side effects including psychosis, pancytopenia, thrombocytopenia, and probably not a good choice given her recent coagulopathy and thrombocytopenia.  Depakote likewise has aplastic anemia, hepatic side effects.  [place]: Stevens-Johnson syndrome, toxic epidermal  necrosis, aplastic anemia, again all places we do  not want to go with her recent history.   Would recheck an EEG on Wednesday.  Regarding her prognosis, would guess she is capable of improvement as she can track, attend, and does follow some command for others, although I could not demonstrate it today.  Cranial nerves are normal, and that is a good sign. However, I expect this would be a slow progress over months.  She may be SACU or nursing home level, especially with a mother.  May want to get an MRI later. Dictated by:   Gustavus Messing Orlin Hilding, M.D. Attending Physician:  Jerl Santos DD:  04/21/02 TD:  04/23/02 Job: [id number]

## 2011-03-31 NOTE — Op Note (Signed)
[place]. [facility]  Patient:    Tammy Bell, Tammy Bell Visit Number: [id number] MRN: [id number]          Service Type: MED Location: MICU 2114 01 Attending Physician:  Jerl Santos Dictated by:   Gloris Manchester. Lazarus Salines, M.D. Proc. Date: 04/17/02 Admit Date:  04/05/2002   CC:         Sherin Quarry, M.D.  Danice Goltz, M.D.   Operative Report  PREOPERATIVE DIAGNOSIS: Prolonged intubation.  POSTOPERATIVE DIAGNOSIS: Prolonged intubation.  PROCEDURE PERFORMED:  Tracheostomy.  SURGEON:  Gloris Manchester. Lazarus Salines, M.D.  ANESTHESIA:  General orotracheal converted to general tracheostoma.  ESTIMATED BLOOD LOSS: Minimal.  COMPLICATIONS: None.  FINDINGS:  A long thin neck with an otherwise normal lower neck anatomy. Tracheostomy tube placed beneath the thyroid isthmus.  PROCEDURE: With the patient in a comfortable supine position, general anesthesia was administered per indwelling orotracheal tube. At an appropriate level, the patient was placed in a slight reverse Trendelenburg.  A shoulder roll was placed and the neck was extended and the head supported.  The neck was palpated with the findings as described above.  1% Xylocaine with 1:100,000 epinephrine, 10 cc total, was infiltrated into the low neck in the proposed surgical field for intraoperative hemostasis.  Several minutes were allowed for hemostasis and anesthesia to take effect.  The sterile preparation and draping of the low neck was accomplished.  The neck was palpated once again. A 3 cm incision was made halfway between the cricoid cartilage and sternal notch and carried down through skin and subcutaneous fat and the superficial layer of the deep cervical fascia.  The midline raphe of the strap muscles was identified and lysed.  One anterior jugular vein was controlled with silk ligatures and divided.  The strap muscles were spread laterally and the anterior face of the trachea was palpated.  Owing  to the length of her neck, the thyroid isthmus was felt to be above the surgical field.  The anterior face of the trachea was cleaned and what was probably the 4-5 innerspace, a transverse incision was made and the tracheal lumen was entered.  An inferior incision through one cartilaginous ring was generated and a 1 cm wide inferiorly based cartilaginous flap was generated and secured to the lower edge of the wound with 2-0 chromic.  The cut mucosal edges were coagulated for hemostasis.  At this point the orotracheal tube was slowly backed out and then a previously tested #6 Shiley tracheostomy tube was placed without difficulty.  Hemostasis was observed. Ventilation assumed per tracheostomy tube.  The cuff was inflated and observed to be intact and containing air.  A trach dressing was placed and the tube was secured in the standard fashion with cotton twill ties.  Again, hemostasis was observed.  Ventilation was adequate per tracheostomy tube. At this point the procedure was completed and the patient was returned to anesthesia, awakened and returned to the intensive care unit in stable condition.  COMMENT:  The patient is a 54 year old [place] female now approximately two weeks into a course of [place] spotted fever with multisystem organ collapse secondary to sepsis.  She has been intubated approximately two weeks, hence the indication for tracheostomy, especially with slow weaning and anticipated prolonged neurologic recovery. Dictated by:   Gloris Manchester. Lazarus Salines, M.D. Attending Physician:  Jerl Santos DD:  04/17/02 TD:  04/20/02 Job: [id number]

## 2011-03-31 NOTE — H&P (Signed)
[place]. [facility]  Patient:    Tammy Bell, Tammy Bell Visit Number: [id number] MRN: [id number]          Service Type: MED Location: MICU 2114 01 Attending Physician:  Jerl Santos Dictated by:   Theressa Millard, M.D. Admit Date:  04/05/2002                           History and Physical  HISTORY OF PRESENT ILLNESS:  Tammy Bell is a 55 year old [place] female admitted with possible sepsis.  She was in her usual state of health until approximately Mar 30, 2002, when she developed fever, myalgias and headache.  The fever lasted about three days, the myalgias were rather severe, and the headache was different from her usual migraines.  She seemed a little bit better over the last two to three days, but on Apr 03, 2002, she developed a more severe headache and on Apr 04, 2002, had fever and some rash.  She was seen yesterday in the office and was felt to be okay.  The only finding was a diffuse non-pruritic rash that has mostly faded.  Her platelet count was low.  Hemoglobin and [place] count were okay.  She came to the hospital today for repeat CBC and the values have not changed significantly; however, the patients husband called to say that she was a bit confused and she was directed to come to the ER immediately.  Upon arrival, she was noted to be hypotensive.  Dr. Read Drivers attempted a left internal jugular line without success and without complication.  A left femoral vein triple-lumen catheter was placed and IV fluids were started; in addition to fluid resuscitation, she was given doxycycline 100 mg IV after labs and blood cultures were obtained.  She has recently had some hypertension and was started on hydrochlorothiazide; she was started on this approximately two months ago but it was changed to a beta blocker by Dr. Clabe Seal. Meryl Crutch recently.  She recalls no recent tick bite.  PAST MEDICAL HISTORY:   Medical:  Migraine headaches.  Menorrhagia.   Needle removed from her foot in 1965.  Palpitations with recent normal 2-D echo.  Surgical:  Hernia repair.  ALLERGIES:  No known drug allergies.  MEDICATIONS:  Recently placed on a beta blocker.  SOCIAL HISTORY:  She is married and has one child.  There is no excessive stress.  FAMILY HISTORY:  Father died of pancreatic cancer.  Mother had hyperlipidemia.  REVIEW OF SYSTEMS:  All of the systems are negative.  PHYSICAL EXAMINATION:  GENERAL:  Skin warm and dry except that her feet are slightly cool and dusky.  VITAL SIGNS:  Initial blood pressure was 68/37, then 84/70 after some IV fluids; pulse 80; respiratory rate 20; temperature 97.3.  SKIN:  Skin reveals a slight macular rash which is apparent on the trunk and extremities but not apparent on the palms and soles.  NECK:  Supple without thyromegaly.  CHEST:  Clear to auscultation and percussion.  CARDIAC:  Normal S1 and S2, without an S3, S4, murmur, rub or click.  ABDOMEN:  Soft and mildly diffusely tender with normal bowel sounds, without hepatosplenomegaly or mass.  EXTREMITIES:  Extremities reveal the feet to be slightly cool and the toes to be slightly dusky.  NEUROLOGIC:  She is slightly slow to answer questions but she moves all her extremities normally.  LABORATORY AND ACCESSORY DATA:  INR 0.9.  Hemoglobin 12.1, Mckeen  count 9400, platelet count 20,000.  Urinalysis is pending.  Sodium 136, potassium 3.6, chloride 102, bicarb 19, BUN 68, creatinine 3.7.  LFTs elevated with SGOT 193, SGPT 107, total protein 5.3, albumin 2.5, bilirubin 1.3, alkaline phosphatase 158.  Chest x-ray shows possible diffuse early airspace disease.  CT of the head reveals no hemorrhage.  IMPRESSION: 1. Fever. 2. Headache. 3. Acute renal failure. 4. Thrombocytopenia. 5. Elevated liver function tests. 6. Fading rash. 7. Possible early infiltrate, diffuse. 8. Mild abdominal tenderness.  She has evidence of sepsis syndrome with  low platelets, fever, mild confusion, acute renal failure, elevated liver function tests and a pulmonary process. Differential diagnosis is bacterial infection, [facility] spotted fever, ichthyosis or other viral process.  I have started the patient on fluid resuscitation and intravenous doxycycline.  We will place her in intensive care unit and monitor for changes in status, Foley will be placed and infectious disease will be consulted. Dictated by:   Theressa Millard, M.D. Attending Physician:  Jerl Santos DD:  04/05/02 TD:  04/06/02 Job: [id number]

## 2012-01-22 ENCOUNTER — Other Ambulatory Visit (HOSPITAL_COMMUNITY): Payer: Self-pay | Admitting: Obstetrics & Gynecology

## 2012-01-22 DIAGNOSIS — Z1231 Encounter for screening mammogram for malignant neoplasm of breast: Secondary | ICD-10-CM

## 2012-02-15 ENCOUNTER — Ambulatory Visit (HOSPITAL_COMMUNITY): Payer: BC Managed Care – PPO

## 2012-03-08 ENCOUNTER — Ambulatory Visit (HOSPITAL_COMMUNITY)
Admission: RE | Admit: 2012-03-08 | Discharge: 2012-03-08 | Disposition: A | Discharge status: Home / Self Care | Payer: PRIVATE HEALTH INSURANCE | Source: Ambulatory Visit | Attending: Obstetrics & Gynecology | Admitting: Obstetrics & Gynecology

## 2012-03-08 DIAGNOSIS — Z1231 Encounter for screening mammogram for malignant neoplasm of breast: Secondary | ICD-10-CM | POA: Insufficient documentation

## 2013-04-17 ENCOUNTER — Other Ambulatory Visit (HOSPITAL_COMMUNITY): Payer: Self-pay | Admitting: Obstetrics & Gynecology

## 2013-04-17 DIAGNOSIS — Z1231 Encounter for screening mammogram for malignant neoplasm of breast: Secondary | ICD-10-CM

## 2013-04-23 ENCOUNTER — Ambulatory Visit (HOSPITAL_COMMUNITY)
Admission: RE | Admit: 2013-04-23 | Discharge: 2013-04-23 | Disposition: A | Discharge status: Home / Self Care | Payer: BC Managed Care – PPO | Source: Ambulatory Visit | Attending: Obstetrics & Gynecology | Admitting: Obstetrics & Gynecology

## 2013-04-23 DIAGNOSIS — Z1231 Encounter for screening mammogram for malignant neoplasm of breast: Secondary | ICD-10-CM | POA: Insufficient documentation

## 2014-03-18 ENCOUNTER — Other Ambulatory Visit (HOSPITAL_COMMUNITY): Payer: Self-pay | Admitting: Obstetrics & Gynecology

## 2014-03-18 DIAGNOSIS — Z1231 Encounter for screening mammogram for malignant neoplasm of breast: Secondary | ICD-10-CM

## 2014-04-24 ENCOUNTER — Ambulatory Visit (HOSPITAL_COMMUNITY)
Admission: RE | Admit: 2014-04-24 | Discharge: 2014-04-24 | Disposition: A | Discharge status: Home / Self Care | Payer: BC Managed Care – PPO | Source: Ambulatory Visit | Attending: Obstetrics & Gynecology | Admitting: Obstetrics & Gynecology

## 2014-04-24 DIAGNOSIS — Z1231 Encounter for screening mammogram for malignant neoplasm of breast: Secondary | ICD-10-CM | POA: Insufficient documentation

## 2014-05-04 ENCOUNTER — Other Ambulatory Visit: Payer: Self-pay | Admitting: Orthopedic Surgery

## 2014-05-04 DIAGNOSIS — M25562 Pain in left knee: Secondary | ICD-10-CM

## 2014-05-06 ENCOUNTER — Other Ambulatory Visit: Payer: PRIVATE HEALTH INSURANCE

## 2014-05-11 ENCOUNTER — Other Ambulatory Visit: Payer: PRIVATE HEALTH INSURANCE

## 2014-05-14 ENCOUNTER — Other Ambulatory Visit: Payer: PRIVATE HEALTH INSURANCE

## 2014-06-30 ENCOUNTER — Other Ambulatory Visit: Payer: Self-pay | Admitting: Dermatology

## 2014-07-01 ENCOUNTER — Encounter: Payer: Self-pay | Admitting: Gastroenterology

## 2015-02-15 ENCOUNTER — Encounter: Payer: Self-pay | Admitting: Gastroenterology

## 2015-04-07 ENCOUNTER — Encounter: Payer: Self-pay | Admitting: Gastroenterology

## 2015-05-20 ENCOUNTER — Ambulatory Visit (AMBULATORY_SURGERY_CENTER): Payer: Self-pay | Admitting: *Deleted

## 2015-05-20 VITALS — Ht 62.0 in | Wt 132.8 lb

## 2015-05-20 DIAGNOSIS — Z86010 Personal history of colonic polyps: Secondary | ICD-10-CM

## 2015-05-20 MED ORDER — NA SULFATE-K SULFATE-MG SULF 17.5-3.13-1.6 GM/177ML PO SOLN: 1.0000 | Freq: Once | ORAL | Status: DC

## 2015-06-01 ENCOUNTER — Encounter: Payer: Self-pay | Admitting: Gastroenterology

## 2015-06-01 ENCOUNTER — Ambulatory Visit (AMBULATORY_SURGERY_CENTER): Payer: Managed Care, Other (non HMO) | Admitting: Gastroenterology

## 2015-06-01 ENCOUNTER — Encounter: Payer: PRIVATE HEALTH INSURANCE | Admitting: Gastroenterology

## 2015-06-01 VITALS — BP 142/56 | HR 51 | Temp 97.4°F | Resp 20 | Ht 62.0 in | Wt 132.0 lb

## 2015-06-01 DIAGNOSIS — Z86010 Personal history of colonic polyps: Secondary | ICD-10-CM | POA: Diagnosis present

## 2015-06-01 MED ORDER — SODIUM CHLORIDE 0.9 % IV SOLN: 500.0000 mL | INTRAVENOUS | Status: DC

## 2015-06-01 NOTE — Patient Instructions (Signed)
YOU HAD AN ENDOSCOPIC PROCEDURE TODAY AT [facility]:   Refer to the procedure report that was given to you for any specific questions about what was found during the examination.  If the procedure report does not answer your questions, please call your gastroenterologist to clarify.  If you requested that your care partner not be given the details of your procedure findings, then the procedure report has been included in a sealed envelope for you to review at your convenience later.  YOU SHOULD EXPECT: Some feelings of bloating in the abdomen. Passage of more gas than usual.  Walking can help get rid of the air that was put into your GI tract during the procedure and reduce the bloating. If you had a lower endoscopy (such as a colonoscopy or flexible sigmoidoscopy) you may notice spotting of blood in your stool or on the toilet paper. If you underwent a bowel prep for your procedure, you may not have a normal bowel movement for a few days.  Please Note:  You might notice some irritation and congestion in your nose or some drainage.  This is from the oxygen used during your procedure.  There is no need for concern and it should clear up in a day or so.  SYMPTOMS TO REPORT IMMEDIATELY:   Following lower endoscopy (colonoscopy or flexible sigmoidoscopy):  Excessive amounts of blood in the stool  Significant tenderness or worsening of abdominal pains  Swelling of the abdomen that is new, acute  Fever of 100F or higher    For urgent or emergent issues, a gastroenterologist can be reached at any hour by calling [phone].   DIET: Your first meal following the procedure should be a small meal and then it is ok to progress to your normal diet. Heavy or fried foods are harder to digest and may make you feel nauseous or bloated.  Likewise, meals heavy in dairy and vegetables can increase bloating.  Drink plenty of fluids but you should avoid alcoholic beverages for 24  hours. Increase the fiber in your diet.  ACTIVITY:  You should plan to take it easy for the rest of today and you should NOT DRIVE or use heavy machinery until tomorrow (because of the sedation medicines used during the test).    FOLLOW UP: Our staff will call the number listed on your records the next business day following your procedure to check on you and address any questions or concerns that you may have regarding the information given to you following your procedure. If we do not reach you, we will leave a message.  However, if you are feeling well and you are not experiencing any problems, there is no need to return our call.  We will assume that you have returned to your regular daily activities without incident.  If any biopsies were taken you will be contacted by phone or by letter within the next 1-3 weeks.  Please call us at [phone] if you have not heard about the biopsies in 3 weeks.   Read all of the handouts given to you by your recovery room nurse.   SIGNATURES/CONFIDENTIALITY: You and/or your care partner have signed paperwork which will be entered into your electronic medical record.  These signatures attest to the fact that that the information above on your After Visit Summary has been reviewed and is understood.  Full responsibility of the confidentiality of this discharge information lies with you and/or your care-partner.

## 2015-06-01 NOTE — Op Note (Signed)
[place]  [address]. [place], [id number]   COLONOSCOPY PROCEDURE REPORT  PATIENT: Tammy Bell, Tammy Bell  MR#: [id number] BIRTHDATE: Jan 20, 1956 , 59  yrs. old GENDER: female ENDOSCOPIST: Ladene Artist, MD, [facility] REFERRED BY: PROCEDURE DATE:  06/01/2015 PROCEDURE:   Colonoscopy, surveillance First Screening Colonoscopy - Avg.  risk and is 50 yrs.  old or older - No.  Prior Negative Screening - Now for repeat screening. N/A  History of Adenoma - Now for follow-up colonoscopy & has been > or = to 3 yrs.  Yes hx of adenoma.  Has been 3 or more years since last colonoscopy.  Polyps removed today? No Recommend repeat exam, <10 yrs? No ASA CLASS:   Class II INDICATIONS:Surveillance due to prior colonic neoplasia and PH Colon Adenoma. MEDICATIONS: Monitored anesthesia care and Propofol 180 mg IV DESCRIPTION OF PROCEDURE:   After the risks benefits and alternatives of the procedure were thoroughly explained, informed consent was obtained.  The digital rectal exam revealed no abnormalities of the rectum.   The LB [id number]  endoscope was introduced through the anus and advanced to the cecum, which was identified by both the appendix and ileocecal valve. No adverse events experienced.   The quality of the prep was good.  (Suprep was used)  The instrument was then slowly withdrawn as the colon was fully examined. Estimated blood loss is zero unless otherwise noted in this procedure report.    COLON FINDINGS: There was mild diverticulosis noted in the sigmoid colon and transverse colon.   The examination was otherwise normal. Retroflexed views revealed no abnormalities. The time to cecum = 3.3 Withdrawal time = 9.7   The scope was withdrawn and the procedure completed. COMPLICATIONS: There were no immediate complications.  ENDOSCOPIC IMPRESSION: 1.   Mild diverticulosis in the sigmoid colon and transverse colon 2.   The examination was otherwise  normal  RECOMMENDATIONS: 1.  High fiber diet with liberal fluid intake. 2.  Continue current colorectal screening recommendations for "routine risk" patients with a repeat colonoscopy in 10 years.  eSigned:  Ladene Artist, MD, [facility] 06/01/2015 8:50 AM   cc: Harlan Stains, MD

## 2015-06-01 NOTE — Progress Notes (Signed)
A/ox3 pleased with MAC, report to Suzanne RN 

## 2015-06-02 ENCOUNTER — Telehealth: Payer: Self-pay | Admitting: *Deleted

## 2015-06-02 NOTE — Telephone Encounter (Signed)
  Follow up Call-  Call back number 06/01/2015  Post procedure Call Back phone  # [phone]  Permission to leave phone message Yes     Patient questions:  Do you have a fever, pain , or abdominal swelling? No. Pain Score  0 *  Have you tolerated food without any problems? Yes.    Have you been able to return to your normal activities? Yes.    Do you have any questions about your discharge instructions: Diet   No. Medications  No. Follow up visit  No.  Do you have questions or concerns about your Care? No.  Actions: * If pain score is 4 or above: No action needed, pain <4.

## 2015-10-30 ENCOUNTER — Emergency Department (HOSPITAL_COMMUNITY): Payer: Managed Care, Other (non HMO)

## 2015-10-30 ENCOUNTER — Encounter (HOSPITAL_COMMUNITY): Payer: Self-pay | Admitting: *Deleted

## 2015-10-30 ENCOUNTER — Emergency Department (HOSPITAL_COMMUNITY)
Admission: EM | Admit: 2015-10-30 | Discharge: 2015-10-30 | Disposition: A | Discharge status: Home / Self Care | Payer: Managed Care, Other (non HMO) | Attending: Emergency Medicine | Admitting: Emergency Medicine

## 2015-10-30 DIAGNOSIS — E785 Hyperlipidemia, unspecified: Secondary | ICD-10-CM | POA: Diagnosis not present

## 2015-10-30 DIAGNOSIS — Z87891 Personal history of nicotine dependence: Secondary | ICD-10-CM | POA: Insufficient documentation

## 2015-10-30 DIAGNOSIS — Z792 Long term (current) use of antibiotics: Secondary | ICD-10-CM | POA: Insufficient documentation

## 2015-10-30 DIAGNOSIS — R109 Unspecified abdominal pain: Secondary | ICD-10-CM

## 2015-10-30 DIAGNOSIS — F419 Anxiety disorder, unspecified: Secondary | ICD-10-CM | POA: Insufficient documentation

## 2015-10-30 DIAGNOSIS — Z79899 Other long term (current) drug therapy: Secondary | ICD-10-CM | POA: Insufficient documentation

## 2015-10-30 DIAGNOSIS — M19041 Primary osteoarthritis, right hand: Secondary | ICD-10-CM | POA: Insufficient documentation

## 2015-10-30 DIAGNOSIS — Z87440 Personal history of urinary (tract) infections: Secondary | ICD-10-CM | POA: Diagnosis not present

## 2015-10-30 DIAGNOSIS — Z8619 Personal history of other infectious and parasitic diseases: Secondary | ICD-10-CM | POA: Insufficient documentation

## 2015-10-30 DIAGNOSIS — M19042 Primary osteoarthritis, left hand: Secondary | ICD-10-CM | POA: Insufficient documentation

## 2015-10-30 DIAGNOSIS — I10 Essential (primary) hypertension: Secondary | ICD-10-CM | POA: Insufficient documentation

## 2015-10-30 DIAGNOSIS — Z8673 Personal history of transient ischemic attack (TIA), and cerebral infarction without residual deficits: Secondary | ICD-10-CM | POA: Diagnosis not present

## 2015-10-30 DIAGNOSIS — N12 Tubulo-interstitial nephritis, not specified as acute or chronic: Secondary | ICD-10-CM | POA: Diagnosis not present

## 2015-10-30 LAB — CBC
HCT: 38.7 % (ref 36.0–46.0)
HEMOGLOBIN: 12.7 g/dL (ref 12.0–15.0)
MCH: 28.8 pg (ref 26.0–34.0)
MCHC: 32.8 g/dL (ref 30.0–36.0)
MCV: 87.8 fL (ref 78.0–100.0)
Platelets: 253 10*3/uL (ref 150–400)
RBC: 4.41 MIL/uL (ref 3.87–5.11)
RDW: 12.8 % (ref 11.5–15.5)
WBC: 7.7 10*3/uL (ref 4.0–10.5)

## 2015-10-30 LAB — URINE MICROSCOPIC-ADD ON: BACTERIA UA: NONE SEEN

## 2015-10-30 LAB — BASIC METABOLIC PANEL
Anion gap: 8 (ref 5–15)
BUN: 12 mg/dL (ref 6–20)
CHLORIDE: 104 mmol/L (ref 101–111)
CO2: 26 mmol/L (ref 22–32)
CREATININE: 0.82 mg/dL (ref 0.44–1.00)
Calcium: 9 mg/dL (ref 8.9–10.3)
GFR calc Af Amer: >60 mL/min (ref >60)
GFR calc non Af Amer: >60 mL/min (ref >60)
GLUCOSE: 123 mg/dL — AB (ref 65–99)
Potassium: 3.3 mmol/L — ABNORMAL LOW (ref 3.5–5.1)
SODIUM: 138 mmol/L (ref 135–145)

## 2015-10-30 LAB — URINALYSIS, ROUTINE W REFLEX MICROSCOPIC
Bilirubin Urine: NEGATIVE
Glucose, UA: NEGATIVE mg/dL
Ketones, ur: NEGATIVE mg/dL
NITRITE: POSITIVE — AB
PROTEIN: NEGATIVE mg/dL
Specific Gravity, Urine: 1.013 (ref 1.005–1.030)
pH: 6 (ref 5.0–8.0)

## 2015-10-30 LAB — I-STAT CG4 LACTIC ACID, ED: LACTIC ACID, VENOUS: 0.7 mmol/L (ref 0.5–2.0)

## 2015-10-30 MED ORDER — KETOROLAC TROMETHAMINE 30 MG/ML IJ SOLN
30.0000 mg | Freq: Once | INTRAMUSCULAR | Status: AC
  Administered 2015-10-30: 30 mg via INTRAVENOUS
  Filled 2015-10-30: qty 1

## 2015-10-30 MED ORDER — DEXTROSE 5 % IV SOLN
1.0000 g | Freq: Once | INTRAVENOUS | Status: AC
  Administered 2015-10-30: 1 g via INTRAVENOUS
  Filled 2015-10-30: qty 10

## 2015-10-30 MED ORDER — CEPHALEXIN 500 MG PO CAPS: 500.0000 mg | ORAL_CAPSULE | Freq: Three times a day (TID) | ORAL | Status: AC

## 2015-10-30 MED ORDER — ONDANSETRON 4 MG PO TBDP: 4.0000 mg | ORAL_TABLET | Freq: Three times a day (TID) | ORAL | Status: DC | PRN

## 2015-10-30 MED ORDER — SODIUM CHLORIDE 0.9 % IV BOLUS (SEPSIS)
1000.0000 mL | Freq: Once | INTRAVENOUS | Status: AC
  Administered 2015-10-30: 1000 mL via INTRAVENOUS

## 2015-10-30 MED ORDER — POTASSIUM CHLORIDE CRYS ER 20 MEQ PO TBCR
40.0000 meq | EXTENDED_RELEASE_TABLET | Freq: Once | ORAL | Status: AC
  Administered 2015-10-30: 40 meq via ORAL
  Filled 2015-10-30: qty 2

## 2015-10-30 NOTE — ED Notes (Signed)
Pt states she was diagnosed with UTI on Thursday at her OBGYN. Pt states she was put on cipro for 3 days, takes her last dose tonight. Pt states she feels worse and has pain in her right flank. Pt states her urine does not have odor now but states she now has chills and body aches.

## 2015-10-30 NOTE — ED Notes (Signed)
Informed MD of patient vital signs

## 2015-10-30 NOTE — ED Notes (Signed)
Delay in lab draw, pt in bathroom 

## 2015-10-30 NOTE — ED Notes (Signed)
Patient transported to CT 

## 2015-10-30 NOTE — ED Provider Notes (Signed)
CSN: [id number]     Arrival date & time 10/30/15  1344 History   First MD Initiated Contact with Patient 10/30/15 1500     Chief Complaint  Patient presents with  . Urinary Tract Infection  . Generalized Body Aches     (Consider location/radiation/quality/duration/timing/severity/associated sxs/prior Treatment) HPI Comments: Urinary frequency, foul smelling urine Diagnosed by OBGYN 6-7/10 right flank, constant Fever here unknown a thome Body aches, chills, fatigue Nausea yesterday No vomiting   Patient is a 59 y.o. female presenting with urinary tract infection.  Urinary Tract Infection Associated symptoms: flank pain and nausea   Associated symptoms: no abdominal pain, no fever and no vomiting     Past Medical History  Diagnosis Date  . Arthritis     hands  . Anxiety   . [place] spotted fever 2003  . Stroke ([facility]) 2003    x 2  . Hyperlipidemia   . Hypertension   . Gangrene ([facility])   . Macular degeneration of right eye    Past Surgical History  Procedure Laterality Date  . Partial toe removal Left   . Cesarean section    . Anal sphincter repair     Family History  Problem Relation Age of Onset  . Colon cancer Maternal Grandmother    Social History  Substance Use Topics  . Smoking status: Former Smoker    Quit date: 11/13/1984  . Smokeless tobacco: Never Used  . Alcohol Use: 1.8 - 2.4 oz/week    3-4 Glasses of wine per week   OB History    No data available     Review of Systems  Constitutional: Negative for fever.  HENT: Negative for sore throat.   Eyes: Negative for visual disturbance.  Respiratory: Negative for cough and shortness of breath.   Cardiovascular: Negative for chest pain.  Gastrointestinal: Positive for nausea. Negative for vomiting, abdominal pain and diarrhea.  Genitourinary: Positive for frequency and flank pain. Negative for difficulty urinating.  Musculoskeletal: Negative for back pain and neck pain.  Skin: Negative for  rash.  Neurological: Negative for syncope and headaches.      Allergies  Bactrim  Home Medications   Prior to Admission medications   Medication Sig Start Date End Date Taking? Authorizing Provider  ALPRAZolam Duanne Moron) 0.25 MG tablet Take 0.5 mg by mouth daily as needed for anxiety.  09/24/15  Yes Historical Provider, MD  atorvastatin (LIPITOR) 40 MG tablet Take 40 mg by mouth daily.   Yes Historical Provider, MD  BIOTIN 5000 PO Take by mouth daily.   Yes Historical Provider, MD  Cholecalciferol (VITAMIN D-3) 1000 UNITS CAPS Take by mouth daily.   Yes Historical Provider, MD  ciprofloxacin (CIPRO) 250 MG tablet Take 250 mg by mouth 2 (two) times daily. 10/28/15  Yes Historical Provider, MD  Coenzyme Q10 10 MG capsule Take 200 mg by mouth daily.   Yes Historical Provider, MD  Dextromethorphan HBr (VICKS DAYQUIL COUGH) 15 MG/15ML LIQD Take 15 mLs by mouth daily as needed (cold symptoms).   Yes Historical Provider, MD  FLUoxetine (PROZAC) 40 MG capsule Take 40 mg by mouth daily.   Yes Historical Provider, MD  guaiFENesin (ROBITUSSIN) 100 MG/5ML liquid Take 200 mg by mouth at bedtime as needed for cough.   Yes Historical Provider, MD  Homeopathic Products [facility] ALLERGY RELIEF NA) Place 1 spray into the nose daily as needed (cold symptoms).   Yes Historical Provider, MD  ibuprofen (ADVIL,MOTRIN) 200 MG tablet Take 400 mg by mouth  daily as needed for headache or moderate pain.   Yes Historical Provider, MD  losartan (COZAAR) 100 MG tablet Take 100 mg by mouth daily.   Yes Historical Provider, MD  magnesium gluconate (MAGONATE) 500 MG tablet Take 500 mg by mouth every other day.   Yes Historical Provider, MD  Multiple Vitamins-Minerals (PRESERVISION AREDS 2 PO) Take by mouth 2 (two) times daily.   Yes Historical Provider, MD  Omega-3 Fatty Acids (FISH OIL PO) Take 1,200 mg by mouth daily.    Yes Historical Provider, MD  oxymetazoline (AFRIN) 0.05 % nasal spray Place 1 spray into both nostrils 2  (two) times daily as needed for congestion.   Yes Historical Provider, MD  Turmeric, Lear Ng, POWD Take 1,000 mg by mouth daily.    Yes Historical Provider, MD  cephALEXin (KEFLEX) 500 MG capsule Take 1 capsule (500 mg total) by mouth 3 (three) times daily. 10/30/15 11/13/15  Gareth Morgan, MD  ondansetron (ZOFRAN ODT) 4 MG disintegrating tablet Take 1 tablet (4 mg total) by mouth every 8 (eight) hours as needed for nausea or vomiting. 10/30/15   Gareth Morgan, MD   BP 134/60 mmHg  Pulse 72  Temp(Src) 99.6 F (37.6 C) (Oral)  Resp 21  Ht 5\' 2"  (1.575 m)  Wt 130 lb (58.968 kg)  BMI 23.77 kg/m2  SpO2 97% Physical Exam  Constitutional: She is oriented to person, place, and time. She appears well-developed and well-nourished. No distress.  HENT:  Head: Normocephalic and atraumatic.  Eyes: Conjunctivae and EOM are normal.  Neck: Normal range of motion.  Cardiovascular: Normal rate, regular rhythm, normal heart sounds and intact distal pulses.  Exam reveals no gallop and no friction rub.   No murmur heard. Pulmonary/Chest: Effort normal and breath sounds normal. No respiratory distress. She has no wheezes. She has no rales.  Abdominal: Soft. She exhibits no distension. There is no tenderness. There is CVA tenderness. There is no guarding.  Musculoskeletal: She exhibits no edema or tenderness.  Neurological: She is alert and oriented to person, place, and time.  Skin: Skin is warm and dry. No rash noted. She is not diaphoretic. No erythema.  Nursing note and vitals reviewed.   ED Course  Procedures (including critical care time) Labs Review Labs Reviewed  URINALYSIS, ROUTINE W REFLEX MICROSCOPIC (NOT AT [facility]) - Abnormal; Notable for the following:    Color, Urine ORANGE (*)    Hgb urine dipstick TRACE (*)    Nitrite POSITIVE (*)    Leukocytes, UA SMALL (*)    All other components within normal limits  BASIC METABOLIC PANEL - Abnormal; Notable for the following:     Potassium 3.3 (*)    Glucose, Bld 123 (*)    All other components within normal limits  URINE MICROSCOPIC-ADD ON - Abnormal; Notable for the following:    Squamous Epithelial / LPF 0-5 (*)    All other components within normal limits  CULTURE, BLOOD (ROUTINE X 2)  CULTURE, BLOOD (ROUTINE X 2)  URINE CULTURE  CBC  I-STAT CG4 LACTIC ACID, ED    Imaging Review Dg Chest 2 View  10/30/2015  CLINICAL DATA:  Fever, RIGHT flank pain, UTI symptoms for 1 week, former smoker, hypertension EXAM: CHEST  2 VIEW COMPARISON:  None. FINDINGS: Normal heart size, mediastinal contours, and pulmonary vascularity. Minimal atherosclerotic calcification at aortic arch. Lungs clear. No pleural effusion or pneumothorax. Slight osseous demineralization without focal bony abnormality. IMPRESSION: No acute abnormalities. Electronically Signed   By:  Lavonia Dana M.D.   On: 10/30/2015 17:42   Ct Renal Stone Study  10/30/2015  CLINICAL DATA:  recent diagnosis of urinary tract infection EXAM: CT ABDOMEN AND PELVIS WITHOUT CONTRAST TECHNIQUE: Multidetector CT imaging of the abdomen and pelvis was performed following the standard protocol without IV contrast. COMPARISON:  None FINDINGS: Lower chest: There is no pleural fluid identified. The lung bases appear clear. Hepatobiliary: No focal liver abnormality. The gallbladder appears within normal limits. Pancreas: The pancreas is unremarkable. Spleen: Negative. Adrenals/Urinary Tract: The adrenal glands are normal. Unremarkable appearance of the left kidney. There is mild right-sided perinephric fat stranding and pelvocaliectasis. No kidney stones identified. No hydronephrosis or hydroureter. The urinary bladder is unremarkable. Stomach/Bowel: The stomach is normal. The small bowel loops have a normal course and caliber. No obstruction. The appendix is visualized and appears normal. No pathologic dilatation of the large bowel. Vascular/Lymphatic: Calcified atherosclerotic disease  involves the abdominal aorta. No aneurysm. No enlarged retroperitoneal or mesenteric adenopathy. No enlarged pelvic or inguinal lymph nodes. Reproductive: The uterus and the adnexal structures are unremarkable. Other: There is no ascites or focal fluid collections within the abdomen or pelvis. Musculoskeletal: The visualized osseous structures are unremarkable. IMPRESSION: 1. There is asymmetric right-sided perinephric fat stranding and pelvocaliectasis. This may reflect pyelonephritis in a patient recently diagnosed with UTI and right flank pain. 2. Aortic atherosclerosis. Electronically Signed   By: Kerby Moors M.D.   On: 10/30/2015 17:50   I have personally reviewed and evaluated these images and lab results as part of my medical decision-making.   EKG Interpretation   Date/Time:  Saturday October 30 2015 15:25:51 EST Ventricular Rate:  65 PR Interval:  155 QRS Duration: 94 QT Interval:  401 QTC Calculation: 417 R Axis:   59 Text Interpretation:  Sinus rhythm Low voltage, precordial leads ED  PHYSICIAN INTERPRETATION AVAILABLE IN [facility] Confirmed by TEST,  Record ([id number]) on 10/31/2015 9:25:03 AM      MDM   Final diagnoses:  Right flank pain  Pyelonephritis   59yo female with history of hypertension, hyperlipidemia, recent diagnosis of UTI by OBGYN presents with concern for flank pain and fever on arrival to ED.  Urinalysis at this time no longer shows infection. XR shows no pneumonia.  CT stone study done given flank pain and shows no sign of nephrolithiasis however shows stranding of right perinephric fat and findings consistent with pyelonephritis. Although urine no longer shows signs of infection, history and imaging findings concerning for pyelonephritis. Pt given rx for 14 days of keflex, zofran for nausea. Patient discharged in stable condition with understanding of reasons to return.    Gareth Morgan, MD 10/31/15 1311

## 2015-10-30 NOTE — ED Notes (Signed)
Nurse drawing labs. 

## 2015-11-01 LAB — URINE CULTURE: Culture: NO GROWTH

## 2015-11-04 LAB — CULTURE, BLOOD (ROUTINE X 2)
CULTURE: NO GROWTH
Culture: NO GROWTH

## 2017-09-20 ENCOUNTER — Other Ambulatory Visit: Payer: Self-pay | Admitting: Obstetrics and Gynecology

## 2017-09-20 DIAGNOSIS — M8580 Other specified disorders of bone density and structure, unspecified site: Secondary | ICD-10-CM

## 2017-10-17 ENCOUNTER — Ambulatory Visit
Admission: RE | Admit: 2017-10-17 | Discharge: 2017-10-17 | Disposition: A | Discharge status: Home / Self Care | Payer: No Typology Code available for payment source | Source: Ambulatory Visit | Attending: Obstetrics and Gynecology | Admitting: Obstetrics and Gynecology

## 2017-10-17 DIAGNOSIS — M8580 Other specified disorders of bone density and structure, unspecified site: Secondary | ICD-10-CM

## 2018-08-12 ENCOUNTER — Emergency Department (HOSPITAL_COMMUNITY)
Admission: EM | Admit: 2018-08-12 | Discharge: 2018-08-13 | Disposition: A | Discharge status: Home / Self Care | Payer: No Typology Code available for payment source | Attending: Emergency Medicine | Admitting: Emergency Medicine

## 2018-08-12 ENCOUNTER — Emergency Department (HOSPITAL_COMMUNITY): Payer: No Typology Code available for payment source

## 2018-08-12 ENCOUNTER — Encounter (HOSPITAL_COMMUNITY): Payer: Self-pay | Admitting: *Deleted

## 2018-08-12 ENCOUNTER — Other Ambulatory Visit: Payer: Self-pay

## 2018-08-12 DIAGNOSIS — R079 Chest pain, unspecified: Secondary | ICD-10-CM | POA: Insufficient documentation

## 2018-08-12 DIAGNOSIS — Z5321 Procedure and treatment not carried out due to patient leaving prior to being seen by health care provider: Secondary | ICD-10-CM | POA: Insufficient documentation

## 2018-08-12 LAB — BASIC METABOLIC PANEL
Anion gap: 13 (ref 5–15)
BUN: 24 mg/dL — ABNORMAL HIGH (ref 8–23)
CHLORIDE: 103 mmol/L (ref 98–111)
CO2: 29 mmol/L (ref 22–32)
CREATININE: 0.74 mg/dL (ref 0.44–1.00)
Calcium: 10.7 mg/dL — ABNORMAL HIGH (ref 8.9–10.3)
GFR calc Af Amer: >60 mL/min (ref >60)
GFR calc non Af Amer: >60 mL/min (ref >60)
Glucose, Bld: 101 mg/dL — ABNORMAL HIGH (ref 70–99)
POTASSIUM: 3.9 mmol/L (ref 3.5–5.1)
SODIUM: 145 mmol/L (ref 135–145)

## 2018-08-12 LAB — CBC
HEMATOCRIT: 44.3 % (ref 36.0–46.0)
Hemoglobin: 15.2 g/dL — ABNORMAL HIGH (ref 12.0–15.0)
MCH: 29.2 pg (ref 26.0–34.0)
MCHC: 34.3 g/dL (ref 30.0–36.0)
MCV: 85.2 fL (ref 78.0–100.0)
PLATELETS: 268 10*3/uL (ref 150–400)
RBC: 5.2 MIL/uL — ABNORMAL HIGH (ref 3.87–5.11)
RDW: 12.4 % (ref 11.5–15.5)
WBC: 6.9 10*3/uL (ref 4.0–10.5)

## 2018-08-12 LAB — POCT I-STAT TROPONIN I: Troponin i, poc: 0 ng/mL (ref 0.00–0.08)

## 2018-08-12 NOTE — ED Triage Notes (Signed)
Pt reports mid sternal cp that radiates to her back which started at 1400.  She denies any SOB, dizziness, or n/v.  Reports taking pepto-bismul thinking it's acid reflux without relief.  She reports the pain is worsening and is radiating to her upper back as well.  Denies arm pain or jaw pain.  She is A&Ox 4.  In NAD.

## 2018-12-03 ENCOUNTER — Encounter: Payer: Self-pay | Admitting: Cardiology

## 2018-12-03 ENCOUNTER — Ambulatory Visit (INDEPENDENT_AMBULATORY_CARE_PROVIDER_SITE_OTHER): Payer: Medicare Other | Admitting: Cardiology

## 2018-12-03 ENCOUNTER — Encounter: Payer: Self-pay | Admitting: *Deleted

## 2018-12-03 ENCOUNTER — Encounter (INDEPENDENT_AMBULATORY_CARE_PROVIDER_SITE_OTHER): Payer: Self-pay

## 2018-12-03 VITALS — BP 148/90 | HR 56 | Ht 62.0 in | Wt 136.6 lb

## 2018-12-03 DIAGNOSIS — E782 Mixed hyperlipidemia: Secondary | ICD-10-CM

## 2018-12-03 DIAGNOSIS — Z87891 Personal history of nicotine dependence: Secondary | ICD-10-CM

## 2018-12-03 DIAGNOSIS — R079 Chest pain, unspecified: Secondary | ICD-10-CM

## 2018-12-03 NOTE — Patient Instructions (Signed)
Medication Instructions:  The current medical regimen is effective;  continue present plan and medications. If you need a refill on your cardiac medications before your next appointment, please call your pharmacy.   Testing/Procedures: Your physician has requested that you have a myoview. For further information please visit [email or web address]. Please follow instruction sheet, as given.  Follow-Up: Further follow up wll be based on the results of the above testing.  Thank you for choosing [facility]!!

## 2018-12-03 NOTE — Progress Notes (Signed)
Cardiology Office Note:    Date:  12/03/2018   ID:  Tammy, Bell [birth year], MRN [id number]  PCP:  Harlan Stains, MD  Cardiologist:  Candee Furbish, MD  Electrophysiologist:  None   Referring MD: Harlan Stains, MD     History of Present Illness:    Tammy Bell is a 63 y.o. female is here for the evaluation of chest discomfort at the request of Ingrid Shifrin.  The emergency department on 08/12/2018 with reports of midsternal chest discomfort that radiated to her back.  She denied any shortness of breath dizziness nausea vomiting.  She took Pepto-Bismol without relief.  Pain seems to be worsening denies any arm or jaw pain.  Sometimes radiating to her upper back.  Troponin was normal.  Hemoglobin 15.2 creatinine 0.74.  Chest x-ray was normal, EKG showed poor R wave progression otherwise unremarkable. Walks, bikes. 12 mile bike. No issues. Episode was non exer.  Fine since then. Few rare episodes, nonexertional  No tob, (used to smoke from [age] years old) no DM. No early CAD FHX. Mother's brothers died of MI.   Lifescreen - mild plaque on carotid.   Past Medical History:  Diagnosis Date  . Anxiety   . Arthritis    hands  . Gangrene ([place])   . Hyperlipidemia   . Hypertension   . Macular degeneration of right eye   . Rocky [place] spotted fever 2003  . Stroke [facility]) 2003   x 2    Past Surgical History:  Procedure Laterality Date  . anal sphincter repair    . CESAREAN SECTION    . partial toe removal Left     Current Medications: Current Meds  Medication Sig  . ALPRAZolam (XANAX) 0.25 MG tablet Take 0.5 mg by mouth daily as needed for anxiety.   Marland Kitchen atorvastatin (LIPITOR) 40 MG tablet Take 40 mg by mouth daily.  Marland Kitchen BIOTIN 5000 PO Take by mouth daily.  . Cholecalciferol (VITAMIN D-3) 1000 UNITS CAPS Take by mouth daily.  . Coenzyme Q10 10 MG capsule Take 200 mg by mouth daily.  Marland Kitchen Dextromethorphan HBr (VICKS DAYQUIL COUGH) 15 MG/15ML LIQD Take 15 mLs by mouth  daily as needed (cold symptoms).  Marland Kitchen FLUoxetine (PROZAC) 10 MG capsule Take 10 mg by mouth daily.  . Homeopathic Products (ZICAM ALLERGY RELIEF NA) Place 1 spray into the nose daily as needed (cold symptoms).  Marland Kitchen ibuprofen (ADVIL,MOTRIN) 200 MG tablet Take 400 mg by mouth daily as needed for headache or moderate pain.  Marland Kitchen losartan (COZAAR) 100 MG tablet Take 100 mg by mouth daily.  . magnesium gluconate (MAGONATE) 500 MG tablet Take 500 mg by mouth every other day.  . Multiple Vitamins-Minerals (PRESERVISION AREDS 2 PO) Take by mouth 2 (two) times daily.  . Omega-3 Fatty Acids (FISH OIL PO) Take 1,200 mg by mouth daily.   Marland Kitchen oxymetazoline (AFRIN) 0.05 % nasal spray Place 1 spray into both nostrils 2 (two) times daily as needed for congestion.  . Turmeric, Curcuma [place]a, POWD Take 1,000 mg by mouth daily.      Allergies:   Bactrim [sulfamethoxazole-trimethoprim]   Social History   Socioeconomic History  . Marital status: Married    Spouse name: Not on file  . Number of children: Not on file  . Years of education: Not on file  . Highest education level: Not on file  Occupational History  . Not on file  Social Needs  . Financial resource strain: Not on file  .  Food insecurity:    Worry: Not on file    Inability: Not on file  . Transportation needs:    Medical: Not on file    Non-medical: Not on file  Tobacco Use  . Smoking status: Former Smoker    Last attempt to quit: 11/13/1984    Years since quitting: 34.0  . Smokeless tobacco: Never Used  Substance and Sexual Activity  . Alcohol use: Yes    Alcohol/week: 3.0 - 4.0 standard drinks    Types: 3 - 4 Glasses of wine per week  . Drug use: No  . Sexual activity: Not on file  Lifestyle  . Physical activity:    Days per week: Not on file    Minutes per session: Not on file  . Stress: Not on file  Relationships  . Social connections:    Talks on phone: Not on file    Gets together: Not on file    Attends religious service: Not on  file    Active member of club or organization: Not on file    Attends meetings of clubs or organizations: Not on file    Relationship status: Not on file  Other Topics Concern  . Not on file  Social History Narrative  . Not on file     Family History: The patient's family history includes Colon cancer in her maternal grandmother.  ROS:   Please see the history of present illness.    Denies any fevers chills nausea vomiting syncope bleeding.  All other systems reviewed and are negative.  EKGs/Labs/Other Studies Reviewed:    The following studies were reviewed today: Office notes reviewed, ER visit x-ray personally reviewed EKG personally reviewed lab work  EKG:  EKG is ordered today.  The ekg ordered today demonstrates sinus bradycardia 56, poor R wave progression, nonspecific ST-T wave changes.  Recent Labs: 08/12/2018: BUN 24; Creatinine, Ser 0.74; Hemoglobin 15.2; Platelets 268; Potassium 3.9; Sodium 145  Recent Lipid Panel No results found for: CHOL, TRIG, HDL, CHOLHDL, VLDL, LDLCALC, LDLDIRECT  Physical Exam:    VS:  BP (!) 148/90   Pulse (!) 56   Ht 5\' 2"  (1.575 m)   Wt 136 lb 9.6 oz (62 kg)   SpO2 96%   BMI 24.98 kg/m     Wt Readings from Last 3 Encounters:  12/03/18 136 lb 9.6 oz (62 kg)  08/12/18 130 lb (59 kg)  10/30/15 130 lb (59 kg)     GEN:  Well nourished, well developed in no acute distress HEENT: Normal NECK: No JVD; No carotid bruits LYMPHATICS: No lymphadenopathy CARDIAC: RRR, no murmurs, rubs, gallops RESPIRATORY:  Clear to auscultation without rales, wheezing or rhonchi  ABDOMEN: Soft, non-tender, non-distended MUSCULOSKELETAL:  No edema; No deformity  SKIN: Warm and dry NEUROLOGIC:  Alert and oriented x 3 PSYCHIATRIC:  Normal affect   ASSESSMENT:    1. Chest pain, unspecified type   2. Mixed hyperlipidemia   3. Former smoker    PLAN:    In order of problems listed above:  Chest pain - Intermediate probability.  Given the  nonspecific changes on her ECG and chest discomfort radiating to her back, we will go ahead and proceed with exercise nuclear stress test.  Want to exclude any high risk ischemia.  Regardless, she is being treated effectively with atorvastatin 40 mg, high intensity statin.  Excellent.  Last LDL was 99, less than 100.  Hyperlipidemia -High intensity statin atorvastatin 40.  LDL 99.  HDL 65.  Former smoker -Brief between 89 to 7 years old.  Thankful that she was able to quit.     Medication Adjustments/Labs and Tests Ordered: Current medicines are reviewed at length with the patient today.  Concerns regarding medicines are outlined above.  Orders Placed This Encounter  Procedures  . MYOCARDIAL PERFUSION IMAGING  . EKG 12-Lead   No orders of the defined types were placed in this encounter.   Patient Instructions  Medication Instructions:  The current medical regimen is effective;  continue present plan and medications. If you need a refill on your cardiac medications before your next appointment, please call your pharmacy.   Testing/Procedures: Your physician has requested that you have a myoview. For further information please visit [email or web address]. Please follow instruction sheet, as given.  Follow-Up: Further follow up wll be based on the results of the above testing.  Thank you for choosing [facility]!!         Signed, Candee Furbish, MD  12/03/2018 10:17 AM    [place]

## 2018-12-04 ENCOUNTER — Telehealth (HOSPITAL_COMMUNITY): Payer: Self-pay | Admitting: *Deleted

## 2018-12-04 NOTE — Telephone Encounter (Signed)
Patient given detailed instructions per Myocardial Perfusion Study Information Sheet for the test on 12/10/18 at 0745. Patient notified to arrive 15 minutes early and that it is imperative to arrive on time for appointment to keep from having the test rescheduled.  If you need to cancel or reschedule your appointment, please call the office within 24 hours of your appointment. . Patient verbalized understanding.Celina Shiley, Ranae Palms No mychart

## 2018-12-10 ENCOUNTER — Ambulatory Visit (HOSPITAL_COMMUNITY): Payer: Medicare Other | Attending: Cardiology

## 2018-12-10 DIAGNOSIS — R079 Chest pain, unspecified: Secondary | ICD-10-CM | POA: Diagnosis not present

## 2018-12-10 LAB — MYOCARDIAL PERFUSION IMAGING
CHL CUP NUCLEAR SSS: 1
CSEPEDS: 0 s
CSEPHR: 96 %
CSEPPHR: 151 {beats}/min
Estimated workload: 7 METS
Exercise duration (min): 6 min
LV dias vol: 53 mL (ref 46–106)
LVSYSVOL: 18 mL
MPHR: 156 {beats}/min
Rest HR: 60 {beats}/min
SDS: 1
SRS: 0
TID: 0.75

## 2018-12-10 MED ORDER — TECHNETIUM TC 99M TETROFOSMIN IV KIT
32.6000 | PACK | Freq: Once | INTRAVENOUS | Status: AC | PRN
  Administered 2018-12-10: 32.6 via INTRAVENOUS
  Filled 2018-12-10: qty 33

## 2018-12-10 MED ORDER — TECHNETIUM TC 99M TETROFOSMIN IV KIT
10.6000 | PACK | Freq: Once | INTRAVENOUS | Status: AC | PRN
  Administered 2018-12-10: 10.6 via INTRAVENOUS
  Filled 2018-12-10: qty 11

## 2019-05-08 DIAGNOSIS — I700 Atherosclerosis of aorta: Secondary | ICD-10-CM | POA: Diagnosis not present

## 2019-05-08 DIAGNOSIS — M8588 Other specified disorders of bone density and structure, other site: Secondary | ICD-10-CM | POA: Diagnosis not present

## 2019-05-08 DIAGNOSIS — E559 Vitamin D deficiency, unspecified: Secondary | ICD-10-CM | POA: Diagnosis not present

## 2019-05-08 DIAGNOSIS — Z89411 Acquired absence of right great toe: Secondary | ICD-10-CM | POA: Diagnosis not present

## 2019-05-08 DIAGNOSIS — Z0000 Encounter for general adult medical examination without abnormal findings: Secondary | ICD-10-CM | POA: Diagnosis not present

## 2019-05-08 DIAGNOSIS — Z23 Encounter for immunization: Secondary | ICD-10-CM | POA: Diagnosis not present

## 2019-05-08 DIAGNOSIS — Z89421 Acquired absence of other right toe(s): Secondary | ICD-10-CM | POA: Diagnosis not present

## 2019-05-08 DIAGNOSIS — I10 Essential (primary) hypertension: Secondary | ICD-10-CM | POA: Diagnosis not present

## 2019-05-08 DIAGNOSIS — F324 Major depressive disorder, single episode, in partial remission: Secondary | ICD-10-CM | POA: Diagnosis not present

## 2019-05-08 DIAGNOSIS — E785 Hyperlipidemia, unspecified: Secondary | ICD-10-CM | POA: Diagnosis not present

## 2019-06-04 DIAGNOSIS — L812 Freckles: Secondary | ICD-10-CM | POA: Diagnosis not present

## 2019-06-04 DIAGNOSIS — D1801 Hemangioma of skin and subcutaneous tissue: Secondary | ICD-10-CM | POA: Diagnosis not present

## 2019-06-04 DIAGNOSIS — L821 Other seborrheic keratosis: Secondary | ICD-10-CM | POA: Diagnosis not present

## 2019-06-04 DIAGNOSIS — D225 Melanocytic nevi of trunk: Secondary | ICD-10-CM | POA: Diagnosis not present

## 2019-06-12 DIAGNOSIS — E785 Hyperlipidemia, unspecified: Secondary | ICD-10-CM | POA: Diagnosis not present

## 2019-06-12 DIAGNOSIS — E559 Vitamin D deficiency, unspecified: Secondary | ICD-10-CM | POA: Diagnosis not present

## 2019-06-12 DIAGNOSIS — I10 Essential (primary) hypertension: Secondary | ICD-10-CM | POA: Diagnosis not present

## 2019-06-30 DIAGNOSIS — Z1151 Encounter for screening for human papillomavirus (HPV): Secondary | ICD-10-CM | POA: Diagnosis not present

## 2019-06-30 DIAGNOSIS — Z01419 Encounter for gynecological examination (general) (routine) without abnormal findings: Secondary | ICD-10-CM | POA: Diagnosis not present

## 2019-06-30 DIAGNOSIS — Z1231 Encounter for screening mammogram for malignant neoplasm of breast: Secondary | ICD-10-CM | POA: Diagnosis not present

## 2019-06-30 DIAGNOSIS — Z124 Encounter for screening for malignant neoplasm of cervix: Secondary | ICD-10-CM | POA: Diagnosis not present

## 2019-06-30 DIAGNOSIS — N952 Postmenopausal atrophic vaginitis: Secondary | ICD-10-CM | POA: Diagnosis not present

## 2019-09-09 DIAGNOSIS — H2513 Age-related nuclear cataract, bilateral: Secondary | ICD-10-CM | POA: Diagnosis not present

## 2019-10-17 DIAGNOSIS — H353132 Nonexudative age-related macular degeneration, bilateral, intermediate dry stage: Secondary | ICD-10-CM | POA: Diagnosis not present

## 2019-10-17 DIAGNOSIS — H43813 Vitreous degeneration, bilateral: Secondary | ICD-10-CM | POA: Diagnosis not present

## 2019-11-27 DIAGNOSIS — H18413 Arcus senilis, bilateral: Secondary | ICD-10-CM | POA: Diagnosis not present

## 2019-11-27 DIAGNOSIS — H25043 Posterior subcapsular polar age-related cataract, bilateral: Secondary | ICD-10-CM | POA: Diagnosis not present

## 2019-11-27 DIAGNOSIS — H353131 Nonexudative age-related macular degeneration, bilateral, early dry stage: Secondary | ICD-10-CM | POA: Diagnosis not present

## 2019-11-27 DIAGNOSIS — H2511 Age-related nuclear cataract, right eye: Secondary | ICD-10-CM | POA: Diagnosis not present

## 2019-11-27 DIAGNOSIS — H2513 Age-related nuclear cataract, bilateral: Secondary | ICD-10-CM | POA: Diagnosis not present

## 2019-11-27 DIAGNOSIS — H25013 Cortical age-related cataract, bilateral: Secondary | ICD-10-CM | POA: Diagnosis not present

## 2019-12-24 DIAGNOSIS — H2511 Age-related nuclear cataract, right eye: Secondary | ICD-10-CM | POA: Diagnosis not present

## 2019-12-25 DIAGNOSIS — H2512 Age-related nuclear cataract, left eye: Secondary | ICD-10-CM | POA: Diagnosis not present

## 2020-01-07 DIAGNOSIS — H2512 Age-related nuclear cataract, left eye: Secondary | ICD-10-CM | POA: Diagnosis not present

## 2020-05-12 DIAGNOSIS — I10 Essential (primary) hypertension: Secondary | ICD-10-CM | POA: Diagnosis not present

## 2020-05-12 DIAGNOSIS — Z23 Encounter for immunization: Secondary | ICD-10-CM | POA: Diagnosis not present

## 2020-05-12 DIAGNOSIS — Z89421 Acquired absence of other right toe(s): Secondary | ICD-10-CM | POA: Diagnosis not present

## 2020-05-12 DIAGNOSIS — Z0000 Encounter for general adult medical examination without abnormal findings: Secondary | ICD-10-CM | POA: Diagnosis not present

## 2020-05-12 DIAGNOSIS — M8588 Other specified disorders of bone density and structure, other site: Secondary | ICD-10-CM | POA: Diagnosis not present

## 2020-05-12 DIAGNOSIS — E559 Vitamin D deficiency, unspecified: Secondary | ICD-10-CM | POA: Diagnosis not present

## 2020-05-12 DIAGNOSIS — Z1159 Encounter for screening for other viral diseases: Secondary | ICD-10-CM | POA: Diagnosis not present

## 2020-05-12 DIAGNOSIS — I700 Atherosclerosis of aorta: Secondary | ICD-10-CM | POA: Diagnosis not present

## 2020-05-12 DIAGNOSIS — E785 Hyperlipidemia, unspecified: Secondary | ICD-10-CM | POA: Diagnosis not present

## 2020-05-12 DIAGNOSIS — S98131D Complete traumatic amputation of one right lesser toe, subsequent encounter: Secondary | ICD-10-CM | POA: Diagnosis not present

## 2020-05-12 DIAGNOSIS — F419 Anxiety disorder, unspecified: Secondary | ICD-10-CM | POA: Diagnosis not present

## 2020-05-12 DIAGNOSIS — F324 Major depressive disorder, single episode, in partial remission: Secondary | ICD-10-CM | POA: Diagnosis not present

## 2020-06-08 DIAGNOSIS — D225 Melanocytic nevi of trunk: Secondary | ICD-10-CM | POA: Diagnosis not present

## 2020-06-08 DIAGNOSIS — L821 Other seborrheic keratosis: Secondary | ICD-10-CM | POA: Diagnosis not present

## 2020-06-08 DIAGNOSIS — L812 Freckles: Secondary | ICD-10-CM | POA: Diagnosis not present

## 2020-06-08 DIAGNOSIS — D1801 Hemangioma of skin and subcutaneous tissue: Secondary | ICD-10-CM | POA: Diagnosis not present

## 2020-07-06 DIAGNOSIS — N952 Postmenopausal atrophic vaginitis: Secondary | ICD-10-CM | POA: Diagnosis not present

## 2020-07-06 DIAGNOSIS — Z1231 Encounter for screening mammogram for malignant neoplasm of breast: Secondary | ICD-10-CM | POA: Diagnosis not present

## 2020-07-06 DIAGNOSIS — Z124 Encounter for screening for malignant neoplasm of cervix: Secondary | ICD-10-CM | POA: Diagnosis not present

## 2020-07-07 DIAGNOSIS — I10 Essential (primary) hypertension: Secondary | ICD-10-CM | POA: Diagnosis not present

## 2020-07-09 ENCOUNTER — Other Ambulatory Visit: Payer: Self-pay | Admitting: Obstetrics and Gynecology

## 2020-07-09 DIAGNOSIS — M8580 Other specified disorders of bone density and structure, unspecified site: Secondary | ICD-10-CM

## 2020-08-06 DIAGNOSIS — I10 Essential (primary) hypertension: Secondary | ICD-10-CM | POA: Diagnosis not present

## 2020-10-25 ENCOUNTER — Ambulatory Visit
Admission: RE | Admit: 2020-10-25 | Discharge: 2020-10-25 | Disposition: A | Discharge status: Home / Self Care | Payer: Medicare Other | Source: Ambulatory Visit | Attending: Obstetrics and Gynecology | Admitting: Obstetrics and Gynecology

## 2020-10-25 ENCOUNTER — Other Ambulatory Visit: Payer: Self-pay

## 2020-10-25 DIAGNOSIS — M8588 Other specified disorders of bone density and structure, other site: Secondary | ICD-10-CM | POA: Diagnosis not present

## 2020-10-25 DIAGNOSIS — Z780 Asymptomatic menopausal state: Secondary | ICD-10-CM | POA: Diagnosis not present

## 2020-10-25 DIAGNOSIS — M8580 Other specified disorders of bone density and structure, unspecified site: Secondary | ICD-10-CM

## 2020-10-29 DIAGNOSIS — Z23 Encounter for immunization: Secondary | ICD-10-CM | POA: Diagnosis not present

## 2020-10-29 DIAGNOSIS — F419 Anxiety disorder, unspecified: Secondary | ICD-10-CM | POA: Diagnosis not present

## 2020-10-29 DIAGNOSIS — I10 Essential (primary) hypertension: Secondary | ICD-10-CM | POA: Diagnosis not present

## 2020-10-29 DIAGNOSIS — E559 Vitamin D deficiency, unspecified: Secondary | ICD-10-CM | POA: Diagnosis not present

## 2020-10-29 DIAGNOSIS — F3342 Major depressive disorder, recurrent, in full remission: Secondary | ICD-10-CM | POA: Diagnosis not present

## 2020-10-29 DIAGNOSIS — E785 Hyperlipidemia, unspecified: Secondary | ICD-10-CM | POA: Diagnosis not present

## 2021-02-08 DIAGNOSIS — H2513 Age-related nuclear cataract, bilateral: Secondary | ICD-10-CM | POA: Diagnosis not present

## 2021-03-22 ENCOUNTER — Other Ambulatory Visit: Payer: Self-pay | Admitting: Family Medicine

## 2021-03-22 DIAGNOSIS — I779 Disorder of arteries and arterioles, unspecified: Secondary | ICD-10-CM

## 2021-03-28 DIAGNOSIS — E039 Hypothyroidism, unspecified: Secondary | ICD-10-CM | POA: Diagnosis not present

## 2021-03-28 DIAGNOSIS — R7982 Elevated C-reactive protein (CRP): Secondary | ICD-10-CM | POA: Diagnosis not present

## 2021-03-28 DIAGNOSIS — E509 Vitamin A deficiency, unspecified: Secondary | ICD-10-CM | POA: Diagnosis not present

## 2021-03-28 DIAGNOSIS — E279 Disorder of adrenal gland, unspecified: Secondary | ICD-10-CM | POA: Diagnosis not present

## 2021-03-28 DIAGNOSIS — E782 Mixed hyperlipidemia: Secondary | ICD-10-CM | POA: Diagnosis not present

## 2021-03-28 DIAGNOSIS — E559 Vitamin D deficiency, unspecified: Secondary | ICD-10-CM | POA: Diagnosis not present

## 2021-03-28 DIAGNOSIS — R5383 Other fatigue: Secondary | ICD-10-CM | POA: Diagnosis not present

## 2021-03-28 DIAGNOSIS — E7212 Methylenetetrahydrofolate reductase deficiency: Secondary | ICD-10-CM | POA: Diagnosis not present

## 2021-03-28 DIAGNOSIS — R790 Abnormal level of blood mineral: Secondary | ICD-10-CM | POA: Diagnosis not present

## 2021-03-28 DIAGNOSIS — R7301 Impaired fasting glucose: Secondary | ICD-10-CM | POA: Diagnosis not present

## 2021-03-28 DIAGNOSIS — I779 Disorder of arteries and arterioles, unspecified: Secondary | ICD-10-CM | POA: Diagnosis not present

## 2021-03-28 DIAGNOSIS — B948 Sequelae of other specified infectious and parasitic diseases: Secondary | ICD-10-CM | POA: Diagnosis not present

## 2021-03-28 DIAGNOSIS — E8881 Metabolic syndrome: Secondary | ICD-10-CM | POA: Diagnosis not present

## 2021-03-28 DIAGNOSIS — E063 Autoimmune thyroiditis: Secondary | ICD-10-CM | POA: Diagnosis not present

## 2021-04-13 ENCOUNTER — Ambulatory Visit
Admission: RE | Admit: 2021-04-13 | Discharge: 2021-04-13 | Disposition: A | Discharge status: Home / Self Care | Payer: No Typology Code available for payment source | Source: Ambulatory Visit | Attending: Family Medicine | Admitting: Family Medicine

## 2021-04-13 DIAGNOSIS — I700 Atherosclerosis of aorta: Secondary | ICD-10-CM | POA: Diagnosis not present

## 2021-04-13 DIAGNOSIS — E785 Hyperlipidemia, unspecified: Secondary | ICD-10-CM | POA: Diagnosis not present

## 2021-04-13 DIAGNOSIS — I779 Disorder of arteries and arterioles, unspecified: Secondary | ICD-10-CM

## 2021-04-18 DIAGNOSIS — M25561 Pain in right knee: Secondary | ICD-10-CM | POA: Diagnosis not present

## 2021-05-20 DIAGNOSIS — Z0000 Encounter for general adult medical examination without abnormal findings: Secondary | ICD-10-CM | POA: Diagnosis not present

## 2021-05-20 DIAGNOSIS — Z89411 Acquired absence of right great toe: Secondary | ICD-10-CM | POA: Diagnosis not present

## 2021-05-20 DIAGNOSIS — E785 Hyperlipidemia, unspecified: Secondary | ICD-10-CM | POA: Diagnosis not present

## 2021-05-20 DIAGNOSIS — F419 Anxiety disorder, unspecified: Secondary | ICD-10-CM | POA: Diagnosis not present

## 2021-05-20 DIAGNOSIS — I700 Atherosclerosis of aorta: Secondary | ICD-10-CM | POA: Diagnosis not present

## 2021-05-20 DIAGNOSIS — E559 Vitamin D deficiency, unspecified: Secondary | ICD-10-CM | POA: Diagnosis not present

## 2021-05-20 DIAGNOSIS — S98131A Complete traumatic amputation of one right lesser toe, initial encounter: Secondary | ICD-10-CM | POA: Diagnosis not present

## 2021-05-20 DIAGNOSIS — F324 Major depressive disorder, single episode, in partial remission: Secondary | ICD-10-CM | POA: Diagnosis not present

## 2021-05-20 DIAGNOSIS — I10 Essential (primary) hypertension: Secondary | ICD-10-CM | POA: Diagnosis not present

## 2021-05-20 DIAGNOSIS — Z89421 Acquired absence of other right toe(s): Secondary | ICD-10-CM | POA: Diagnosis not present

## 2021-05-20 DIAGNOSIS — Z1389 Encounter for screening for other disorder: Secondary | ICD-10-CM | POA: Diagnosis not present

## 2021-06-08 DIAGNOSIS — L218 Other seborrheic dermatitis: Secondary | ICD-10-CM | POA: Diagnosis not present

## 2021-06-08 DIAGNOSIS — D1801 Hemangioma of skin and subcutaneous tissue: Secondary | ICD-10-CM | POA: Diagnosis not present

## 2021-06-08 DIAGNOSIS — L821 Other seborrheic keratosis: Secondary | ICD-10-CM | POA: Diagnosis not present

## 2021-06-08 DIAGNOSIS — L814 Other melanin hyperpigmentation: Secondary | ICD-10-CM | POA: Diagnosis not present

## 2021-07-07 DIAGNOSIS — Z124 Encounter for screening for malignant neoplasm of cervix: Secondary | ICD-10-CM | POA: Diagnosis not present

## 2021-07-07 DIAGNOSIS — Z01419 Encounter for gynecological examination (general) (routine) without abnormal findings: Secondary | ICD-10-CM | POA: Diagnosis not present

## 2021-07-07 DIAGNOSIS — M8580 Other specified disorders of bone density and structure, unspecified site: Secondary | ICD-10-CM | POA: Diagnosis not present

## 2021-07-07 DIAGNOSIS — N952 Postmenopausal atrophic vaginitis: Secondary | ICD-10-CM | POA: Diagnosis not present

## 2021-07-07 DIAGNOSIS — Z1231 Encounter for screening mammogram for malignant neoplasm of breast: Secondary | ICD-10-CM | POA: Diagnosis not present

## 2021-07-07 DIAGNOSIS — Z01411 Encounter for gynecological examination (general) (routine) with abnormal findings: Secondary | ICD-10-CM | POA: Diagnosis not present

## 2021-10-05 DIAGNOSIS — Z124 Encounter for screening for malignant neoplasm of cervix: Secondary | ICD-10-CM | POA: Diagnosis not present

## 2021-10-05 DIAGNOSIS — N952 Postmenopausal atrophic vaginitis: Secondary | ICD-10-CM | POA: Diagnosis not present

## 2021-10-16 IMAGING — CT CT CARDIAC CORONARY ARTERY CALCIUM SCORE
3 series · 13 of 20 positions shown, 15 images · non-contrast
Comparison: None.

CLINICAL DATA: 65-year-old Caucasian female with history of
hyperlipidemia and family history of heart disease.

EXAM:
CT CARDIAC CORONARY ARTERY CALCIUM SCORE
TECHNIQUE: Non-contrast imaging through the heart was performed using
prospective ECG gating. Image post processing was performed on an
independent workstation, allowing for quantitative analysis of the
heart and coronary arteries. Note that this exam targets the heart
and the chest was not imaged in its entirety.

[Series 2: calcium scoring 2.00 qr36 bestdiast 70% hrt calciu · axial · 0.35mm/px · z∈[+1747,+1795]mm · 3 of 60 slices shown]
[im 12/60  vessel]
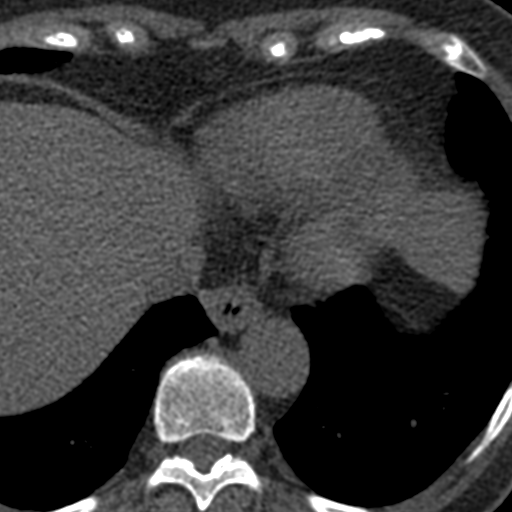
[im 24/60  vessel]
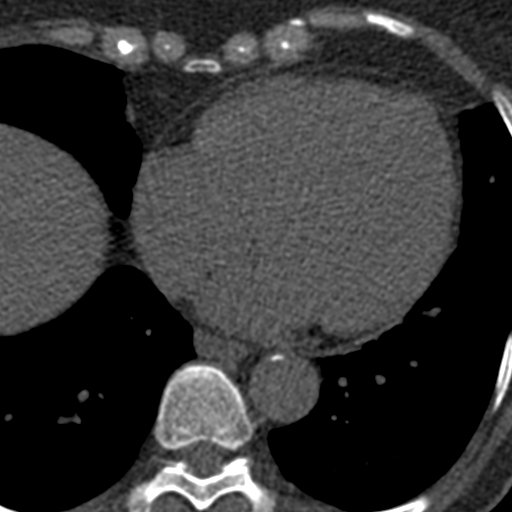
[im 36/60  vessel]
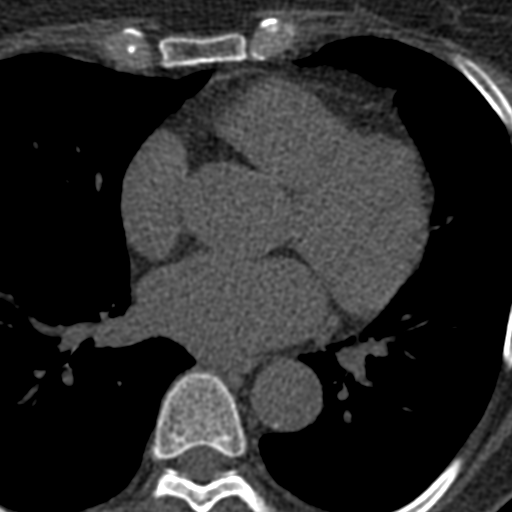

[Series 3: calcium scoring 2.00 br40 bestdiast 70% axial · axial · 0.53mm/px · z∈[+1743,+1823]mm · 5 of 60 slices shown, 7 images]
[im 10/60  vessel]
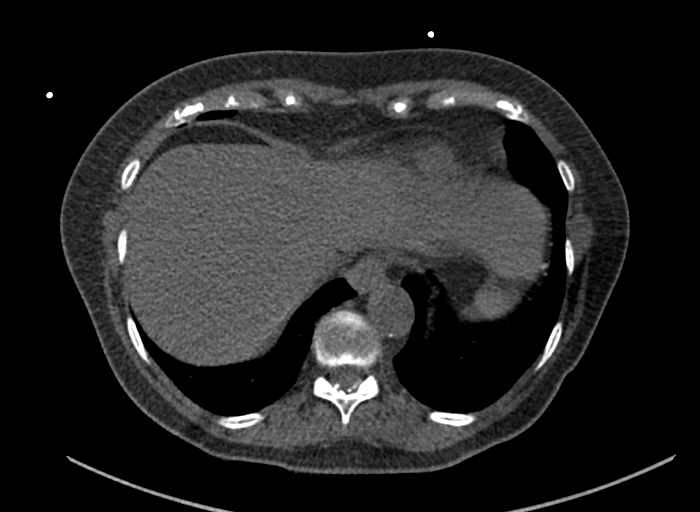
[im 10/60  lung]
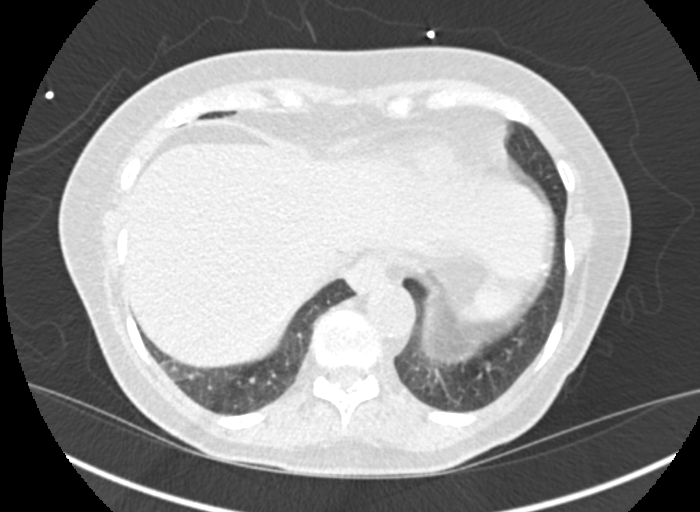
[im 20/60  vessel]
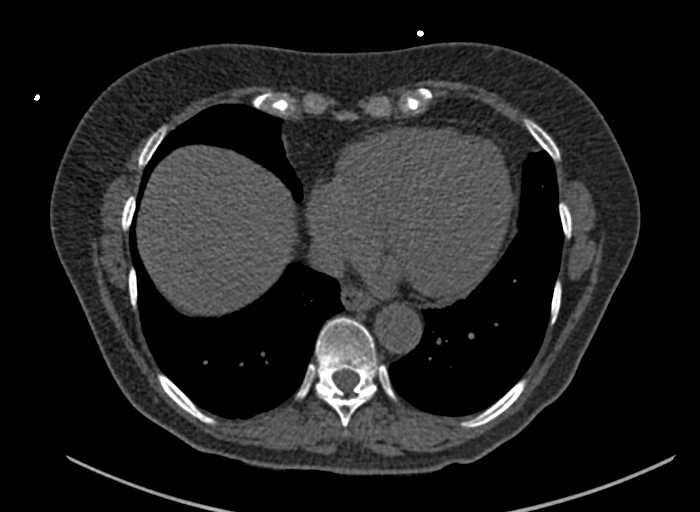
[im 30/60  vessel]
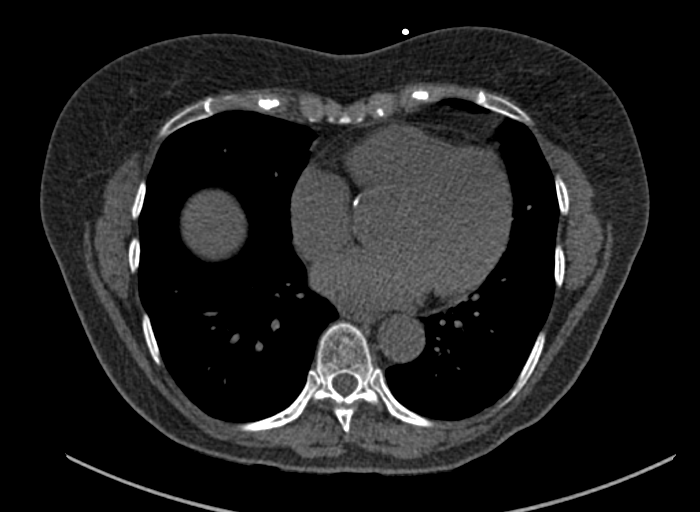
[im 40/60  vessel]
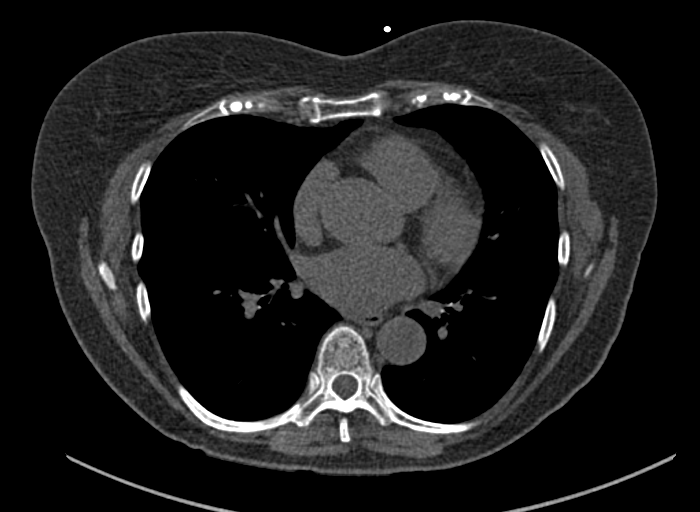
[im 50/60  vessel]
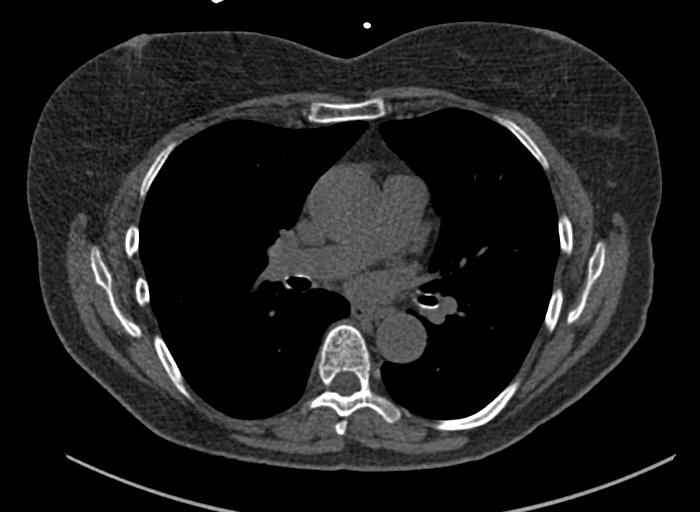
[im 50/60  lung]
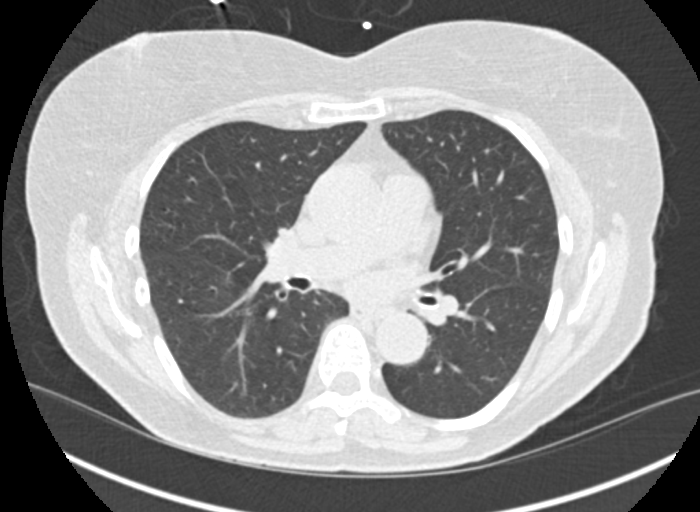

[Series 9: calcium scoring 2.00 br60 bestdiast 70% lungs · axial · 0.53mm/px · z∈[+1743,+1823]mm · 5 of 60 slices shown]
[im 10/60  vessel]
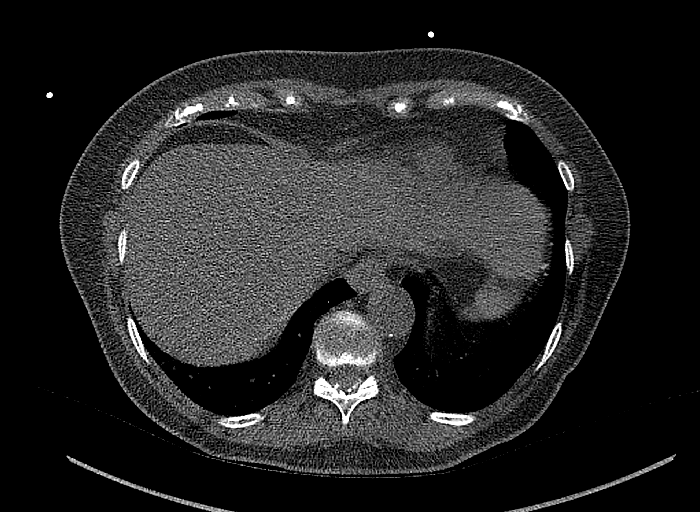
[im 20/60  vessel]
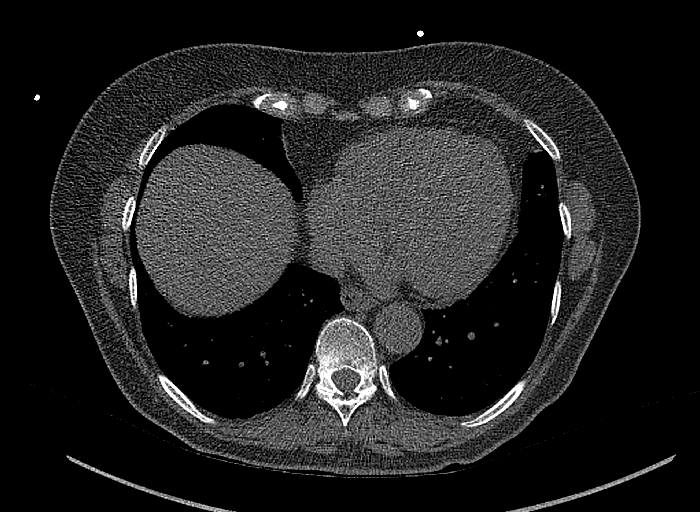
[im 30/60  vessel]
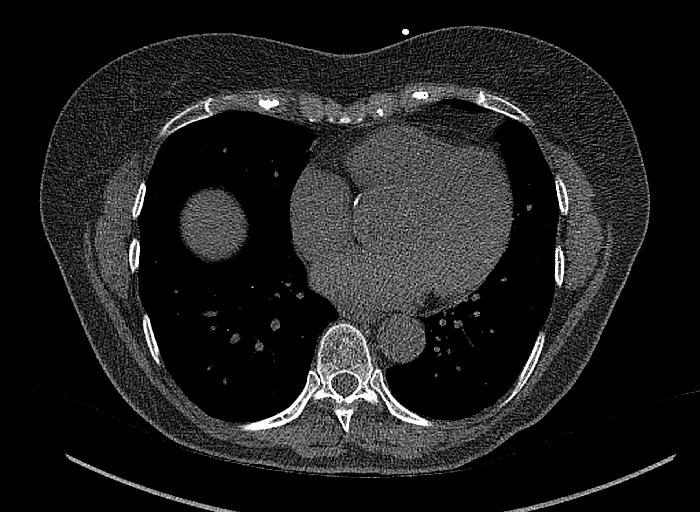
[im 40/60  vessel]
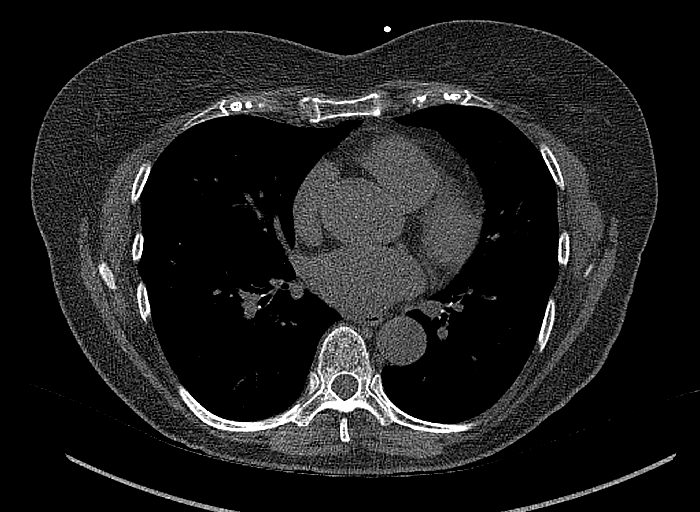
[im 50/60  vessel]
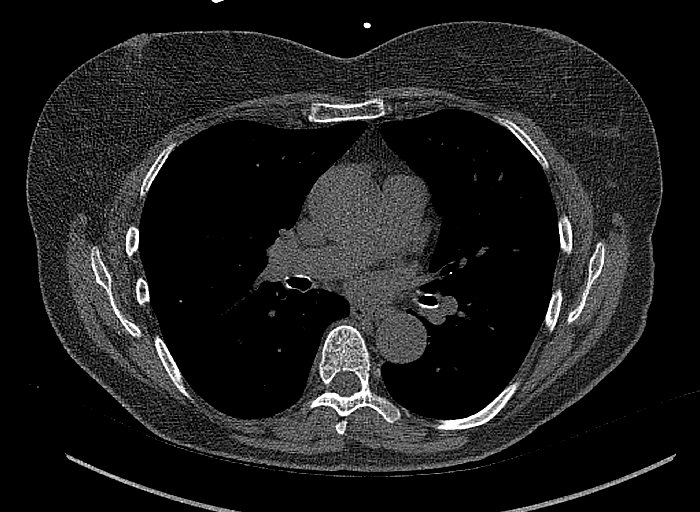

[13 of 20 positions shown; findings below may reference images not displayed]

FINDINGS: CORONARY CALCIUM SCORES:

Left Main: 0

LAD: 0

LCx: 0

RCA: 0

Total Agatston Score: 0

[HOSPITAL] percentile: 0

AORTA MEASUREMENTS:

Ascending Aorta: 39 mm

Descending Aorta: 25 mm

OTHER FINDINGS:

The heart size is within normal limits. No pericardial fluid is
identified. The visualized ascending thoracic aorta is top-normal in
caliber. There is some scattered calcified plaque in the visualized
descending thoracic aorta. Visualized central pulmonary arteries are
normal in caliber. Visualized mediastinum and hilar regions
demonstrate no lymphadenopathy or masses. Visualized lungs show no
evidence of pulmonary edema, consolidation, pneumothorax, nodule or
pleural fluid. Visualized upper abdomen and bony structures are
unremarkable.
IMPRESSION: 1. Coronary calcium score of 0.
2. Top-normal caliber of the ascending thoracic aorta with
atherosclerosis of the visualized descending thoracic aorta.
Correlation with echocardiography may be helpful.

## 2021-10-28 DIAGNOSIS — E8881 Metabolic syndrome: Secondary | ICD-10-CM | POA: Diagnosis not present

## 2021-10-28 DIAGNOSIS — E559 Vitamin D deficiency, unspecified: Secondary | ICD-10-CM | POA: Diagnosis not present

## 2021-10-28 DIAGNOSIS — R7303 Prediabetes: Secondary | ICD-10-CM | POA: Diagnosis not present

## 2021-10-28 DIAGNOSIS — I779 Disorder of arteries and arterioles, unspecified: Secondary | ICD-10-CM | POA: Diagnosis not present

## 2021-10-28 DIAGNOSIS — E7841 Elevated Lipoprotein(a): Secondary | ICD-10-CM | POA: Diagnosis not present

## 2021-10-28 DIAGNOSIS — R7301 Impaired fasting glucose: Secondary | ICD-10-CM | POA: Diagnosis not present

## 2021-10-28 DIAGNOSIS — R7982 Elevated C-reactive protein (CRP): Secondary | ICD-10-CM | POA: Diagnosis not present

## 2021-10-28 DIAGNOSIS — E782 Mixed hyperlipidemia: Secondary | ICD-10-CM | POA: Diagnosis not present

## 2021-11-24 DIAGNOSIS — E559 Vitamin D deficiency, unspecified: Secondary | ICD-10-CM | POA: Diagnosis not present

## 2021-11-24 DIAGNOSIS — I10 Essential (primary) hypertension: Secondary | ICD-10-CM | POA: Diagnosis not present

## 2021-11-24 DIAGNOSIS — I700 Atherosclerosis of aorta: Secondary | ICD-10-CM | POA: Diagnosis not present

## 2021-11-24 DIAGNOSIS — E785 Hyperlipidemia, unspecified: Secondary | ICD-10-CM | POA: Diagnosis not present

## 2021-11-24 DIAGNOSIS — R350 Frequency of micturition: Secondary | ICD-10-CM | POA: Diagnosis not present

## 2021-11-24 DIAGNOSIS — F324 Major depressive disorder, single episode, in partial remission: Secondary | ICD-10-CM | POA: Diagnosis not present

## 2021-11-24 DIAGNOSIS — F419 Anxiety disorder, unspecified: Secondary | ICD-10-CM | POA: Diagnosis not present

## 2022-02-08 DIAGNOSIS — H353131 Nonexudative age-related macular degeneration, bilateral, early dry stage: Secondary | ICD-10-CM | POA: Diagnosis not present

## 2022-03-01 DIAGNOSIS — L570 Actinic keratosis: Secondary | ICD-10-CM | POA: Diagnosis not present

## 2022-03-01 DIAGNOSIS — D485 Neoplasm of uncertain behavior of skin: Secondary | ICD-10-CM | POA: Diagnosis not present

## 2022-03-27 DIAGNOSIS — F419 Anxiety disorder, unspecified: Secondary | ICD-10-CM | POA: Diagnosis not present

## 2022-03-27 DIAGNOSIS — M8588 Other specified disorders of bone density and structure, other site: Secondary | ICD-10-CM | POA: Diagnosis not present

## 2022-03-27 DIAGNOSIS — Z89411 Acquired absence of right great toe: Secondary | ICD-10-CM | POA: Diagnosis not present

## 2022-03-27 DIAGNOSIS — E785 Hyperlipidemia, unspecified: Secondary | ICD-10-CM | POA: Diagnosis not present

## 2022-03-27 DIAGNOSIS — E559 Vitamin D deficiency, unspecified: Secondary | ICD-10-CM | POA: Diagnosis not present

## 2022-03-27 DIAGNOSIS — H6123 Impacted cerumen, bilateral: Secondary | ICD-10-CM | POA: Diagnosis not present

## 2022-03-27 DIAGNOSIS — F324 Major depressive disorder, single episode, in partial remission: Secondary | ICD-10-CM | POA: Diagnosis not present

## 2022-03-27 DIAGNOSIS — I700 Atherosclerosis of aorta: Secondary | ICD-10-CM | POA: Diagnosis not present

## 2022-03-27 DIAGNOSIS — I10 Essential (primary) hypertension: Secondary | ICD-10-CM | POA: Diagnosis not present

## 2022-03-27 DIAGNOSIS — Z89421 Acquired absence of other right toe(s): Secondary | ICD-10-CM | POA: Diagnosis not present

## 2022-03-27 DIAGNOSIS — Z0000 Encounter for general adult medical examination without abnormal findings: Secondary | ICD-10-CM | POA: Diagnosis not present

## 2022-05-09 DIAGNOSIS — D1801 Hemangioma of skin and subcutaneous tissue: Secondary | ICD-10-CM | POA: Diagnosis not present

## 2022-05-09 DIAGNOSIS — L814 Other melanin hyperpigmentation: Secondary | ICD-10-CM | POA: Diagnosis not present

## 2022-05-09 DIAGNOSIS — L578 Other skin changes due to chronic exposure to nonionizing radiation: Secondary | ICD-10-CM | POA: Diagnosis not present

## 2022-05-09 DIAGNOSIS — L821 Other seborrheic keratosis: Secondary | ICD-10-CM | POA: Diagnosis not present

## 2022-05-09 DIAGNOSIS — D229 Melanocytic nevi, unspecified: Secondary | ICD-10-CM | POA: Diagnosis not present

## 2022-05-09 DIAGNOSIS — L570 Actinic keratosis: Secondary | ICD-10-CM | POA: Diagnosis not present

## 2022-05-15 DIAGNOSIS — B948 Sequelae of other specified infectious and parasitic diseases: Secondary | ICD-10-CM | POA: Diagnosis not present

## 2022-05-15 DIAGNOSIS — I779 Disorder of arteries and arterioles, unspecified: Secondary | ICD-10-CM | POA: Diagnosis not present

## 2022-05-15 DIAGNOSIS — R7982 Elevated C-reactive protein (CRP): Secondary | ICD-10-CM | POA: Diagnosis not present

## 2022-05-15 DIAGNOSIS — R790 Abnormal level of blood mineral: Secondary | ICD-10-CM | POA: Diagnosis not present

## 2022-05-15 DIAGNOSIS — E559 Vitamin D deficiency, unspecified: Secondary | ICD-10-CM | POA: Diagnosis not present

## 2022-05-15 DIAGNOSIS — D899 Disorder involving the immune mechanism, unspecified: Secondary | ICD-10-CM | POA: Diagnosis not present

## 2022-05-15 DIAGNOSIS — E8881 Metabolic syndrome: Secondary | ICD-10-CM | POA: Diagnosis not present

## 2022-05-15 DIAGNOSIS — E7841 Elevated Lipoprotein(a): Secondary | ICD-10-CM | POA: Diagnosis not present

## 2022-05-15 DIAGNOSIS — R7301 Impaired fasting glucose: Secondary | ICD-10-CM | POA: Diagnosis not present

## 2022-05-15 DIAGNOSIS — I2510 Atherosclerotic heart disease of native coronary artery without angina pectoris: Secondary | ICD-10-CM | POA: Diagnosis not present

## 2022-05-15 DIAGNOSIS — E7212 Methylenetetrahydrofolate reductase deficiency: Secondary | ICD-10-CM | POA: Diagnosis not present

## 2022-07-06 ENCOUNTER — Other Ambulatory Visit: Payer: Self-pay | Admitting: Obstetrics and Gynecology

## 2022-07-06 DIAGNOSIS — Z0142 Encounter for cervical smear to confirm findings of recent normal smear following initial abnormal smear: Secondary | ICD-10-CM | POA: Diagnosis not present

## 2022-07-06 DIAGNOSIS — N952 Postmenopausal atrophic vaginitis: Secondary | ICD-10-CM | POA: Diagnosis not present

## 2022-07-06 DIAGNOSIS — M8580 Other specified disorders of bone density and structure, unspecified site: Secondary | ICD-10-CM | POA: Diagnosis not present

## 2022-07-06 DIAGNOSIS — Z01419 Encounter for gynecological examination (general) (routine) without abnormal findings: Secondary | ICD-10-CM | POA: Diagnosis not present

## 2022-07-06 DIAGNOSIS — Z124 Encounter for screening for malignant neoplasm of cervix: Secondary | ICD-10-CM | POA: Diagnosis not present

## 2022-07-06 DIAGNOSIS — Z01411 Encounter for gynecological examination (general) (routine) with abnormal findings: Secondary | ICD-10-CM | POA: Diagnosis not present

## 2022-07-06 DIAGNOSIS — Z6824 Body mass index (BMI) 24.0-24.9, adult: Secondary | ICD-10-CM | POA: Diagnosis not present

## 2022-07-06 DIAGNOSIS — Z1231 Encounter for screening mammogram for malignant neoplasm of breast: Secondary | ICD-10-CM | POA: Diagnosis not present

## 2022-10-18 DIAGNOSIS — E785 Hyperlipidemia, unspecified: Secondary | ICD-10-CM | POA: Diagnosis not present

## 2022-10-18 DIAGNOSIS — F324 Major depressive disorder, single episode, in partial remission: Secondary | ICD-10-CM | POA: Diagnosis not present

## 2022-10-18 DIAGNOSIS — I700 Atherosclerosis of aorta: Secondary | ICD-10-CM | POA: Diagnosis not present

## 2022-10-18 DIAGNOSIS — I10 Essential (primary) hypertension: Secondary | ICD-10-CM | POA: Diagnosis not present

## 2022-10-18 DIAGNOSIS — F419 Anxiety disorder, unspecified: Secondary | ICD-10-CM | POA: Diagnosis not present

## 2022-11-14 DIAGNOSIS — E7212 Methylenetetrahydrofolate reductase deficiency: Secondary | ICD-10-CM | POA: Diagnosis not present

## 2022-11-14 DIAGNOSIS — D899 Disorder involving the immune mechanism, unspecified: Secondary | ICD-10-CM | POA: Diagnosis not present

## 2022-11-14 DIAGNOSIS — E559 Vitamin D deficiency, unspecified: Secondary | ICD-10-CM | POA: Diagnosis not present

## 2022-11-14 DIAGNOSIS — I779 Disorder of arteries and arterioles, unspecified: Secondary | ICD-10-CM | POA: Diagnosis not present

## 2022-11-14 DIAGNOSIS — R7301 Impaired fasting glucose: Secondary | ICD-10-CM | POA: Diagnosis not present

## 2022-11-14 DIAGNOSIS — R790 Abnormal level of blood mineral: Secondary | ICD-10-CM | POA: Diagnosis not present

## 2022-11-14 DIAGNOSIS — B948 Sequelae of other specified infectious and parasitic diseases: Secondary | ICD-10-CM | POA: Diagnosis not present

## 2022-11-14 DIAGNOSIS — R799 Abnormal finding of blood chemistry, unspecified: Secondary | ICD-10-CM | POA: Diagnosis not present

## 2022-11-14 DIAGNOSIS — I2510 Atherosclerotic heart disease of native coronary artery without angina pectoris: Secondary | ICD-10-CM | POA: Diagnosis not present

## 2022-11-14 DIAGNOSIS — E039 Hypothyroidism, unspecified: Secondary | ICD-10-CM | POA: Diagnosis not present

## 2022-11-14 DIAGNOSIS — D6489 Other specified anemias: Secondary | ICD-10-CM | POA: Diagnosis not present

## 2022-11-14 DIAGNOSIS — R5383 Other fatigue: Secondary | ICD-10-CM | POA: Diagnosis not present

## 2022-11-14 DIAGNOSIS — E7841 Elevated Lipoprotein(a): Secondary | ICD-10-CM | POA: Diagnosis not present

## 2022-11-29 DIAGNOSIS — R7301 Impaired fasting glucose: Secondary | ICD-10-CM | POA: Diagnosis not present

## 2022-11-29 DIAGNOSIS — E8881 Metabolic syndrome: Secondary | ICD-10-CM | POA: Diagnosis not present

## 2023-01-18 DIAGNOSIS — Z8619 Personal history of other infectious and parasitic diseases: Secondary | ICD-10-CM | POA: Diagnosis not present

## 2023-01-18 DIAGNOSIS — I10 Essential (primary) hypertension: Secondary | ICD-10-CM | POA: Diagnosis not present

## 2023-01-18 DIAGNOSIS — Z8673 Personal history of transient ischemic attack (TIA), and cerebral infarction without residual deficits: Secondary | ICD-10-CM | POA: Diagnosis not present

## 2023-01-18 DIAGNOSIS — K635 Polyp of colon: Secondary | ICD-10-CM | POA: Diagnosis not present

## 2023-01-18 DIAGNOSIS — F411 Generalized anxiety disorder: Secondary | ICD-10-CM | POA: Diagnosis not present

## 2023-01-18 DIAGNOSIS — T753XXA Motion sickness, initial encounter: Secondary | ICD-10-CM | POA: Diagnosis not present

## 2023-01-18 DIAGNOSIS — E785 Hyperlipidemia, unspecified: Secondary | ICD-10-CM | POA: Diagnosis not present

## 2023-01-18 DIAGNOSIS — Z1330 Encounter for screening examination for mental health and behavioral disorders, unspecified: Secondary | ICD-10-CM | POA: Diagnosis not present

## 2023-01-18 DIAGNOSIS — M8580 Other specified disorders of bone density and structure, unspecified site: Secondary | ICD-10-CM | POA: Diagnosis not present

## 2023-01-18 DIAGNOSIS — E559 Vitamin D deficiency, unspecified: Secondary | ICD-10-CM | POA: Diagnosis not present

## 2023-02-16 DIAGNOSIS — H353131 Nonexudative age-related macular degeneration, bilateral, early dry stage: Secondary | ICD-10-CM | POA: Diagnosis not present

## 2023-02-26 DIAGNOSIS — E8881 Metabolic syndrome: Secondary | ICD-10-CM | POA: Diagnosis not present

## 2023-02-26 DIAGNOSIS — E7841 Elevated Lipoprotein(a): Secondary | ICD-10-CM | POA: Diagnosis not present

## 2023-02-26 DIAGNOSIS — E782 Mixed hyperlipidemia: Secondary | ICD-10-CM | POA: Diagnosis not present

## 2023-02-26 DIAGNOSIS — R799 Abnormal finding of blood chemistry, unspecified: Secondary | ICD-10-CM | POA: Diagnosis not present

## 2023-02-26 DIAGNOSIS — R7301 Impaired fasting glucose: Secondary | ICD-10-CM | POA: Diagnosis not present

## 2023-02-26 DIAGNOSIS — R5383 Other fatigue: Secondary | ICD-10-CM | POA: Diagnosis not present

## 2023-02-26 DIAGNOSIS — D6489 Other specified anemias: Secondary | ICD-10-CM | POA: Diagnosis not present

## 2023-02-26 DIAGNOSIS — R947 Abnormal results of other endocrine function studies: Secondary | ICD-10-CM | POA: Diagnosis not present

## 2023-03-28 DIAGNOSIS — I10 Essential (primary) hypertension: Secondary | ICD-10-CM | POA: Diagnosis not present

## 2023-03-28 DIAGNOSIS — S161XXS Strain of muscle, fascia and tendon at neck level, sequela: Secondary | ICD-10-CM | POA: Diagnosis not present

## 2023-03-29 DIAGNOSIS — S161XXA Strain of muscle, fascia and tendon at neck level, initial encounter: Secondary | ICD-10-CM | POA: Diagnosis not present

## 2023-03-29 DIAGNOSIS — M47812 Spondylosis without myelopathy or radiculopathy, cervical region: Secondary | ICD-10-CM | POA: Diagnosis not present

## 2023-04-26 DIAGNOSIS — U071 COVID-19: Secondary | ICD-10-CM | POA: Diagnosis not present

## 2023-05-08 DIAGNOSIS — H6122 Impacted cerumen, left ear: Secondary | ICD-10-CM | POA: Diagnosis not present

## 2023-05-08 DIAGNOSIS — I10 Essential (primary) hypertension: Secondary | ICD-10-CM | POA: Diagnosis not present

## 2023-05-08 DIAGNOSIS — H6990 Unspecified Eustachian tube disorder, unspecified ear: Secondary | ICD-10-CM | POA: Diagnosis not present

## 2023-05-10 DIAGNOSIS — L821 Other seborrheic keratosis: Secondary | ICD-10-CM | POA: Diagnosis not present

## 2023-05-10 DIAGNOSIS — L578 Other skin changes due to chronic exposure to nonionizing radiation: Secondary | ICD-10-CM | POA: Diagnosis not present

## 2023-05-10 DIAGNOSIS — D229 Melanocytic nevi, unspecified: Secondary | ICD-10-CM | POA: Diagnosis not present

## 2023-05-10 DIAGNOSIS — L814 Other melanin hyperpigmentation: Secondary | ICD-10-CM | POA: Diagnosis not present

## 2023-05-10 DIAGNOSIS — D1801 Hemangioma of skin and subcutaneous tissue: Secondary | ICD-10-CM | POA: Diagnosis not present

## 2023-05-10 DIAGNOSIS — L570 Actinic keratosis: Secondary | ICD-10-CM | POA: Diagnosis not present

## 2023-05-24 ENCOUNTER — Ambulatory Visit
Admission: RE | Admit: 2023-05-24 | Discharge: 2023-05-24 | Disposition: A | Discharge status: Home / Self Care | Payer: Medicare Other | Source: Ambulatory Visit | Attending: Obstetrics and Gynecology | Admitting: Obstetrics and Gynecology

## 2023-05-24 DIAGNOSIS — N958 Other specified menopausal and perimenopausal disorders: Secondary | ICD-10-CM | POA: Diagnosis not present

## 2023-05-24 DIAGNOSIS — E349 Endocrine disorder, unspecified: Secondary | ICD-10-CM | POA: Diagnosis not present

## 2023-05-24 DIAGNOSIS — M8580 Other specified disorders of bone density and structure, unspecified site: Secondary | ICD-10-CM

## 2023-05-31 DIAGNOSIS — I779 Disorder of arteries and arterioles, unspecified: Secondary | ICD-10-CM | POA: Diagnosis not present

## 2023-05-31 DIAGNOSIS — E7212 Methylenetetrahydrofolate reductase deficiency: Secondary | ICD-10-CM | POA: Diagnosis not present

## 2023-05-31 DIAGNOSIS — B948 Sequelae of other specified infectious and parasitic diseases: Secondary | ICD-10-CM | POA: Diagnosis not present

## 2023-05-31 DIAGNOSIS — R790 Abnormal level of blood mineral: Secondary | ICD-10-CM | POA: Diagnosis not present

## 2023-05-31 DIAGNOSIS — D899 Disorder involving the immune mechanism, unspecified: Secondary | ICD-10-CM | POA: Diagnosis not present

## 2023-05-31 DIAGNOSIS — R7301 Impaired fasting glucose: Secondary | ICD-10-CM | POA: Diagnosis not present

## 2023-05-31 DIAGNOSIS — E559 Vitamin D deficiency, unspecified: Secondary | ICD-10-CM | POA: Diagnosis not present

## 2023-05-31 DIAGNOSIS — I2510 Atherosclerotic heart disease of native coronary artery without angina pectoris: Secondary | ICD-10-CM | POA: Diagnosis not present

## 2023-05-31 DIAGNOSIS — E7841 Elevated Lipoprotein(a): Secondary | ICD-10-CM | POA: Diagnosis not present

## 2023-06-20 DIAGNOSIS — M8580 Other specified disorders of bone density and structure, unspecified site: Secondary | ICD-10-CM | POA: Diagnosis not present

## 2023-07-18 DIAGNOSIS — Z1231 Encounter for screening mammogram for malignant neoplasm of breast: Secondary | ICD-10-CM | POA: Diagnosis not present

## 2023-07-18 DIAGNOSIS — Z124 Encounter for screening for malignant neoplasm of cervix: Secondary | ICD-10-CM | POA: Diagnosis not present

## 2023-07-18 DIAGNOSIS — Z01419 Encounter for gynecological examination (general) (routine) without abnormal findings: Secondary | ICD-10-CM | POA: Diagnosis not present

## 2023-07-18 DIAGNOSIS — Z01411 Encounter for gynecological examination (general) (routine) with abnormal findings: Secondary | ICD-10-CM | POA: Diagnosis not present

## 2023-07-19 DIAGNOSIS — Z8619 Personal history of other infectious and parasitic diseases: Secondary | ICD-10-CM | POA: Diagnosis not present

## 2023-07-19 DIAGNOSIS — I10 Essential (primary) hypertension: Secondary | ICD-10-CM | POA: Diagnosis not present

## 2023-07-19 DIAGNOSIS — Z0000 Encounter for general adult medical examination without abnormal findings: Secondary | ICD-10-CM | POA: Diagnosis not present

## 2023-07-19 DIAGNOSIS — F410 Panic disorder [episodic paroxysmal anxiety] without agoraphobia: Secondary | ICD-10-CM | POA: Diagnosis not present

## 2023-07-19 DIAGNOSIS — E559 Vitamin D deficiency, unspecified: Secondary | ICD-10-CM | POA: Diagnosis not present

## 2023-07-19 DIAGNOSIS — F411 Generalized anxiety disorder: Secondary | ICD-10-CM | POA: Diagnosis not present

## 2023-07-19 DIAGNOSIS — E785 Hyperlipidemia, unspecified: Secondary | ICD-10-CM | POA: Diagnosis not present

## 2023-07-19 DIAGNOSIS — M8580 Other specified disorders of bone density and structure, unspecified site: Secondary | ICD-10-CM | POA: Diagnosis not present

## 2023-07-19 DIAGNOSIS — Z780 Asymptomatic menopausal state: Secondary | ICD-10-CM | POA: Diagnosis not present

## 2023-07-19 DIAGNOSIS — Z8673 Personal history of transient ischemic attack (TIA), and cerebral infarction without residual deficits: Secondary | ICD-10-CM | POA: Diagnosis not present

## 2024-03-21 ENCOUNTER — Encounter (INDEPENDENT_AMBULATORY_CARE_PROVIDER_SITE_OTHER): Payer: Self-pay | Admitting: Otolaryngology

## 2024-03-21 ENCOUNTER — Ambulatory Visit (INDEPENDENT_AMBULATORY_CARE_PROVIDER_SITE_OTHER): Admitting: Otolaryngology

## 2024-03-21 VITALS — BP 115/72 | HR 72 | Ht 62.0 in | Wt 125.0 lb

## 2024-03-21 DIAGNOSIS — H60331 Swimmer's ear, right ear: Secondary | ICD-10-CM

## 2024-03-21 DIAGNOSIS — H6121 Impacted cerumen, right ear: Secondary | ICD-10-CM | POA: Diagnosis not present

## 2024-03-23 DIAGNOSIS — H60331 Swimmer's ear, right ear: Secondary | ICD-10-CM | POA: Insufficient documentation

## 2024-03-23 DIAGNOSIS — H6121 Impacted cerumen, right ear: Secondary | ICD-10-CM | POA: Insufficient documentation

## 2024-03-23 DIAGNOSIS — H6123 Impacted cerumen, bilateral: Secondary | ICD-10-CM | POA: Insufficient documentation

## 2024-03-23 NOTE — Progress Notes (Signed)
 CC: Right ear pain, right ear bleeding  HPI:  Tammy Bell is a 68 y.o. female who presents today complaining of right ear pain and right ear bleeding for the past 2 weeks.  According to the patient, she was noted to have right ear cerumen impaction 2 weeks ago.  She was treated with right ear irrigation.  However, it resulted in significant right ear pain and right ear bleeding for 3 days.  She denies any significant hearing difficulty.  She has no left ear symptoms.  She has no previous otologic surgery.  Past Medical History:  Diagnosis Date   Anxiety    Arthritis    hands   Gangrene (HCC)    Hyperlipidemia    Hypertension    Macular degeneration of right eye    [place] spotted fever 2003   Stroke [facility]) 2003   x 2    Past Surgical History:  Procedure Laterality Date   anal sphincter repair     CESAREAN SECTION     partial toe removal Left     Family History  Problem Relation Age of Onset   Colon cancer Maternal Grandmother     Social History:  reports that she quit smoking about 39 years ago. She has never used smokeless tobacco. She reports current alcohol use of about 3.0 - 4.0 standard drinks of alcohol per week. She reports that she does not use drugs.  Allergies:  Allergies  Allergen Reactions   Bactrim [Sulfamethoxazole-Trimethoprim] Nausea Only    Prior to Admission medications   Medication Sig Start Date End Date Taking? Authorizing Provider  ALPRAZolam (XANAX) 0.25 MG tablet Take 0.5 mg by mouth daily as needed for anxiety.  09/24/15  Yes [provider]  atorvastatin (LIPITOR) 40 MG tablet Take 40 mg by mouth daily.   Yes [provider]  BIOTIN 5000 PO Take by mouth daily.   Yes [provider]  Cholecalciferol (VITAMIN D-3) 1000 UNITS CAPS Take by mouth daily.   Yes [provider]  Coenzyme Q10 10 MG capsule Take 200 mg by mouth daily.   Yes [provider]  Dextromethorphan HBr (VICKS DAYQUIL COUGH)  15 MG/15ML LIQD Take 15 mLs by mouth daily as needed (cold symptoms).   Yes [provider]  FLUoxetine (PROZAC) 10 MG capsule Take 10 mg by mouth daily. 10/21/18  Yes [provider]  ibuprofen (ADVIL,MOTRIN) 200 MG tablet Take 400 mg by mouth daily as needed for headache or moderate pain.   Yes [provider]  losartan (COZAAR) 100 MG tablet Take 100 mg by mouth daily.   Yes [provider]  magnesium gluconate (MAGONATE) 500 MG tablet Take 500 mg by mouth every other day.   Yes [provider]  Multiple Vitamins-Minerals (PRESERVISION AREDS 2 PO) Take by mouth 2 (two) times daily.   Yes [provider]  Omega-3 Fatty Acids (FISH OIL PO) Take 1,200 mg by mouth daily.    Yes [provider]  oxymetazoline (AFRIN) 0.05 % nasal spray Place 1 spray into both nostrils 2 (two) times daily as needed for congestion.   Yes [provider]  Turmeric, Fermin Hove, POWD Take 1,000 mg by mouth daily.    Yes [provider]  Homeopathic Products (ZICAM ALLERGY RELIEF NA) Place 1 spray into the nose daily as needed (cold symptoms).    [provider]    Blood pressure 115/72, pulse 72, height 5\' 2"  (1.575 m), weight 125 lb (56.7  kg), SpO2 95%. Exam: General: Communicates without difficulty, well nourished, no acute distress. Head: Normocephalic, no evidence injury, no tenderness, facial buttresses intact without stepoff. Face/sinus: No tenderness to palpation and percussion. Facial movement is normal and symmetric. Eyes: PERRL, EOMI. No scleral icterus, conjunctivae clear. Neuro: CN II exam reveals vision grossly intact.  No nystagmus at any point of gaze. Ears: Auricles well formed without lesions.  Right ear cerumen impaction.  Dried blood clots are also noted within the right ear canal.  The left ear canal and tympanic membrane are normal.  Nose: External evaluation reveals normal support and skin without lesions.  Dorsum  is intact.  Anterior rhinoscopy reveals congested mucosa over anterior aspect of inferior turbinates and intact septum.  No purulence noted. Oral:  Oral cavity and oropharynx are intact, symmetric, without erythema or edema.  Mucosa is moist without lesions. Neck: Full range of motion without pain.  There is no significant lymphadenopathy.  No masses palpable.  Thyroid  bed within normal limits to palpation.  Parotid glands and submandibular glands equal bilaterally without mass.  Trachea is midline. Neuro:  CN 2-12 grossly intact.   Procedure: Right ear cerumen disimpaction Anesthesia: None Description: Under the operating microscope, the cerumen and dried blood clots are carefully removed with a combination of cerumen currette, alligator forceps, and suction catheters.  After the cerumen is removed, the right ear canal is mildly irritated.  No mass, erythema, or lesions. The patient tolerated the procedure well.    Assessment: 1.  Right ear cerumen impaction.  Dried blood clots are also noted within the right ear canal. 2.  After the cerumen removal procedure, the right ear canal is mildly inflamed.  Plan: 1.  Otomicroscopy with right ear cerumen removal.  Dried blood clots are also evacuated. 2.  The physical exam findings are reviewed with the patient. 3.  Dry ear precautions on the right side. 4.  The patient will return for reevaluation in 6 months.  Tammy Bell Tammy Bell 03/23/2024, 8:55 PM

## 2024-09-19 ENCOUNTER — Encounter (INDEPENDENT_AMBULATORY_CARE_PROVIDER_SITE_OTHER): Payer: Self-pay | Admitting: Otolaryngology

## 2024-09-19 ENCOUNTER — Ambulatory Visit (INDEPENDENT_AMBULATORY_CARE_PROVIDER_SITE_OTHER): Admitting: Otolaryngology

## 2024-09-19 VITALS — BP 149/90 | HR 66 | Temp 98.2°F | Ht 62.0 in | Wt 128.0 lb

## 2024-09-19 DIAGNOSIS — H6123 Impacted cerumen, bilateral: Secondary | ICD-10-CM | POA: Diagnosis not present

## 2024-09-19 NOTE — Progress Notes (Signed)
 Patient ID: Tammy Bell, female   DOB: Mar 13, 1956, 68 y.o.   MRN: [id number]  Procedure: Bilateral cerumen disimpaction.   Indication: Cerumen impaction, resulting in ear discomfort and conductive hearing loss.   Description: The patient is placed supine on the operating table. Under the operating microscope, the right ear canal is examined and is noted to be impacted with cerumen. The cerumen is carefully removed with a combination of suction catheters, cerumen curette, and alligator forceps. After the cerumen removal, the ear canal and tympanic membrane are noted to be normal. No middle ear effusion is noted. The same procedure is then repeated on the left side without exception. The patient tolerated the procedure well.  Follow-up care:  The patient will follow up in 6 months.

## 2025-03-10 ENCOUNTER — Ambulatory Visit (INDEPENDENT_AMBULATORY_CARE_PROVIDER_SITE_OTHER): Admitting: Otolaryngology
